# Patient Record
Sex: Male | Born: 1965 | Race: White | Hispanic: No | Marital: Married | Smoking: Never smoker
Health system: Southern US, Community
[De-identification: ages and names within clinical notes are randomized; demographics above are authoritative.]

## PROBLEM LIST (undated history)

## (undated) DIAGNOSIS — I10 Essential (primary) hypertension: Secondary | ICD-10-CM

## (undated) DIAGNOSIS — E785 Hyperlipidemia, unspecified: Secondary | ICD-10-CM

## (undated) HISTORY — PX: OTHER SURGICAL HISTORY: SHX169

## (undated) HISTORY — DX: Essential (primary) hypertension: I10

## (undated) HISTORY — DX: Hyperlipidemia, unspecified: E78.5

## (undated) HISTORY — PX: WRIST SURGERY: SHX841

---

## 2004-05-18 HISTORY — PX: OTHER SURGICAL HISTORY: SHX169

## 2004-08-28 ENCOUNTER — Emergency Department: Payer: Self-pay | Admitting: Emergency Medicine

## 2004-08-29 ENCOUNTER — Emergency Department: Payer: Self-pay | Admitting: Unknown Physician Specialty

## 2004-09-02 ENCOUNTER — Ambulatory Visit: Payer: Self-pay | Admitting: Unknown Physician Specialty

## 2004-10-20 ENCOUNTER — Ambulatory Visit: Payer: Self-pay | Admitting: Family Medicine

## 2005-03-20 ENCOUNTER — Ambulatory Visit: Payer: Self-pay | Admitting: Family Medicine

## 2005-10-15 ENCOUNTER — Ambulatory Visit: Payer: Self-pay | Admitting: Family Medicine

## 2006-09-03 ENCOUNTER — Ambulatory Visit: Payer: Self-pay | Admitting: Family Medicine

## 2006-09-03 LAB — CONVERTED CEMR LAB: Chlamydia, DNA Probe: NEGATIVE

## 2007-01-27 ENCOUNTER — Telehealth: Payer: Self-pay | Admitting: Family Medicine

## 2007-01-27 DIAGNOSIS — I10 Essential (primary) hypertension: Secondary | ICD-10-CM

## 2007-01-27 DIAGNOSIS — E785 Hyperlipidemia, unspecified: Secondary | ICD-10-CM | POA: Insufficient documentation

## 2007-01-27 DIAGNOSIS — Z87898 Personal history of other specified conditions: Secondary | ICD-10-CM

## 2007-01-28 ENCOUNTER — Encounter (INDEPENDENT_AMBULATORY_CARE_PROVIDER_SITE_OTHER): Payer: Self-pay | Admitting: *Deleted

## 2007-03-21 IMAGING — CR DG WRIST COMPLETE 3+V*R*
1 series · 4 of 4 positions shown · non-contrast
Comparison: none

REASON FOR EXAM: pain in wrist  deformed wrist   [HOSPITAL]
COMMENTS:

[Series 1: view not recorded · 0.17mm/px · 4 of 4 slices shown]
[im 1/4]
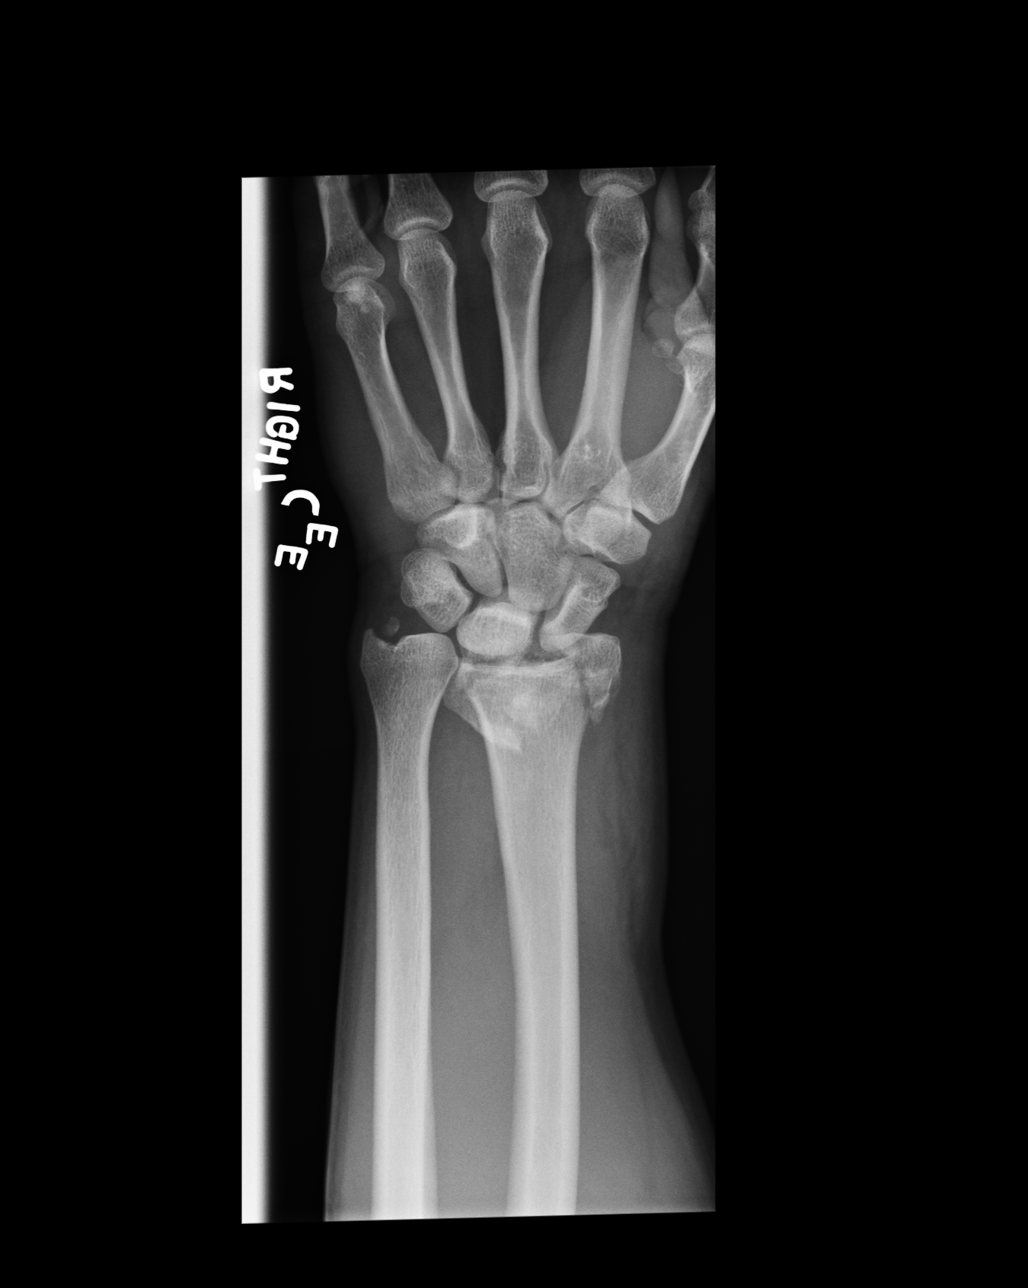
[im 2/4]
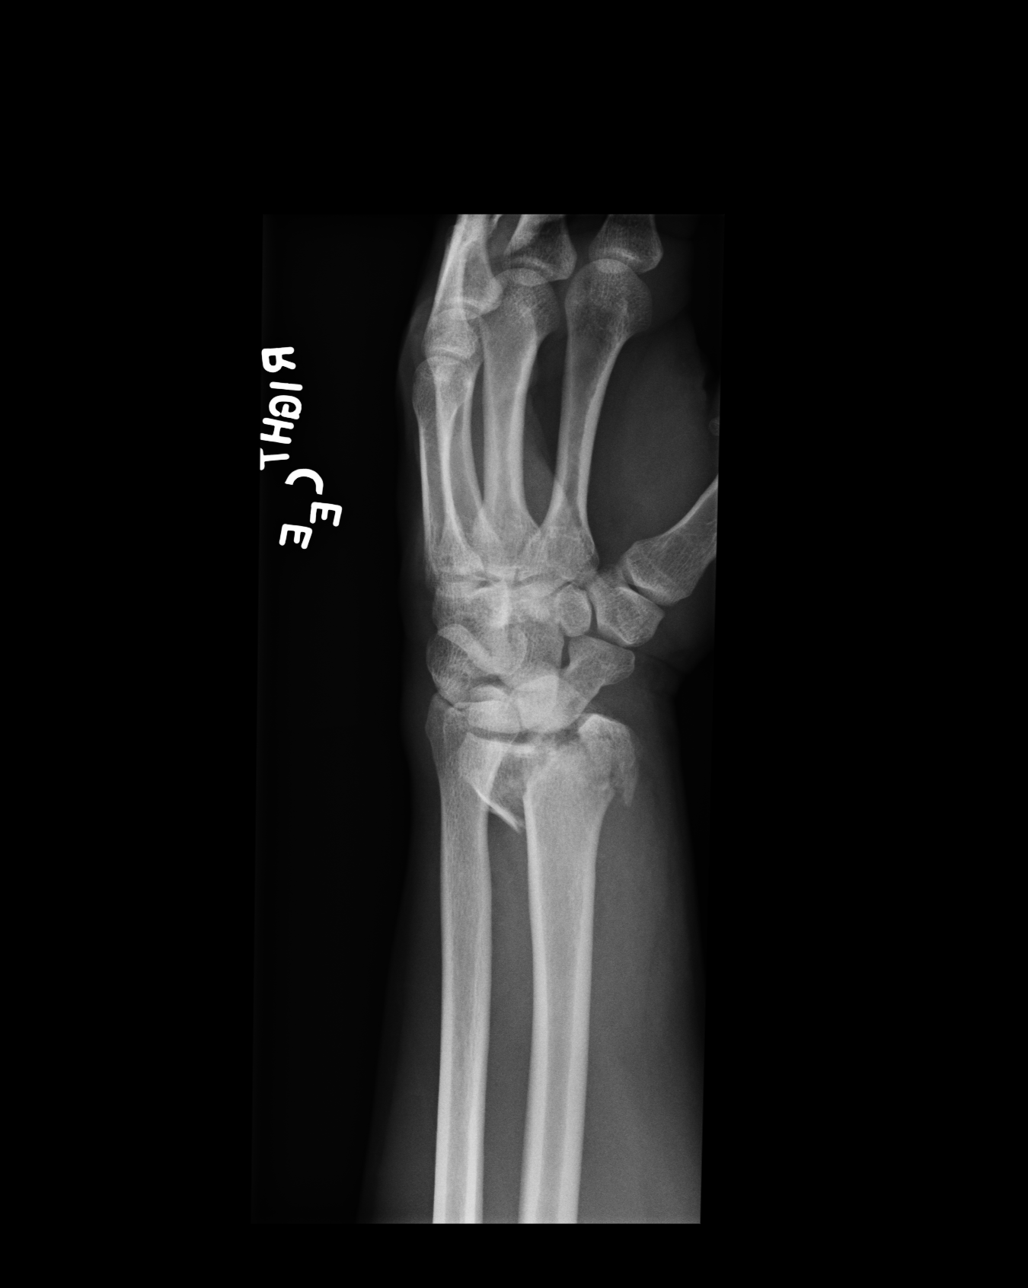
[im 3/4]
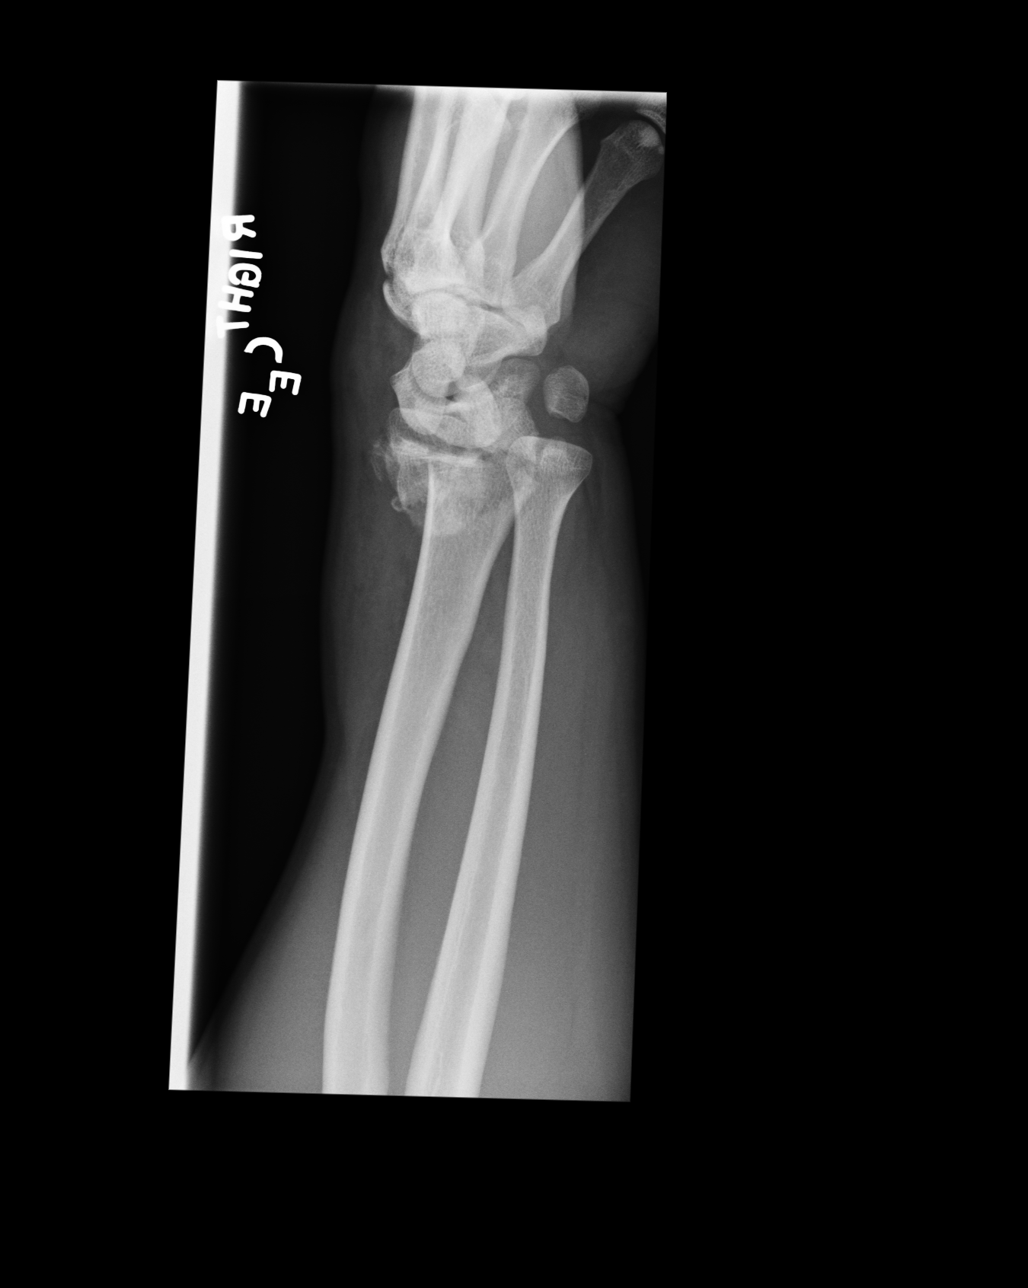
[im 4/4]
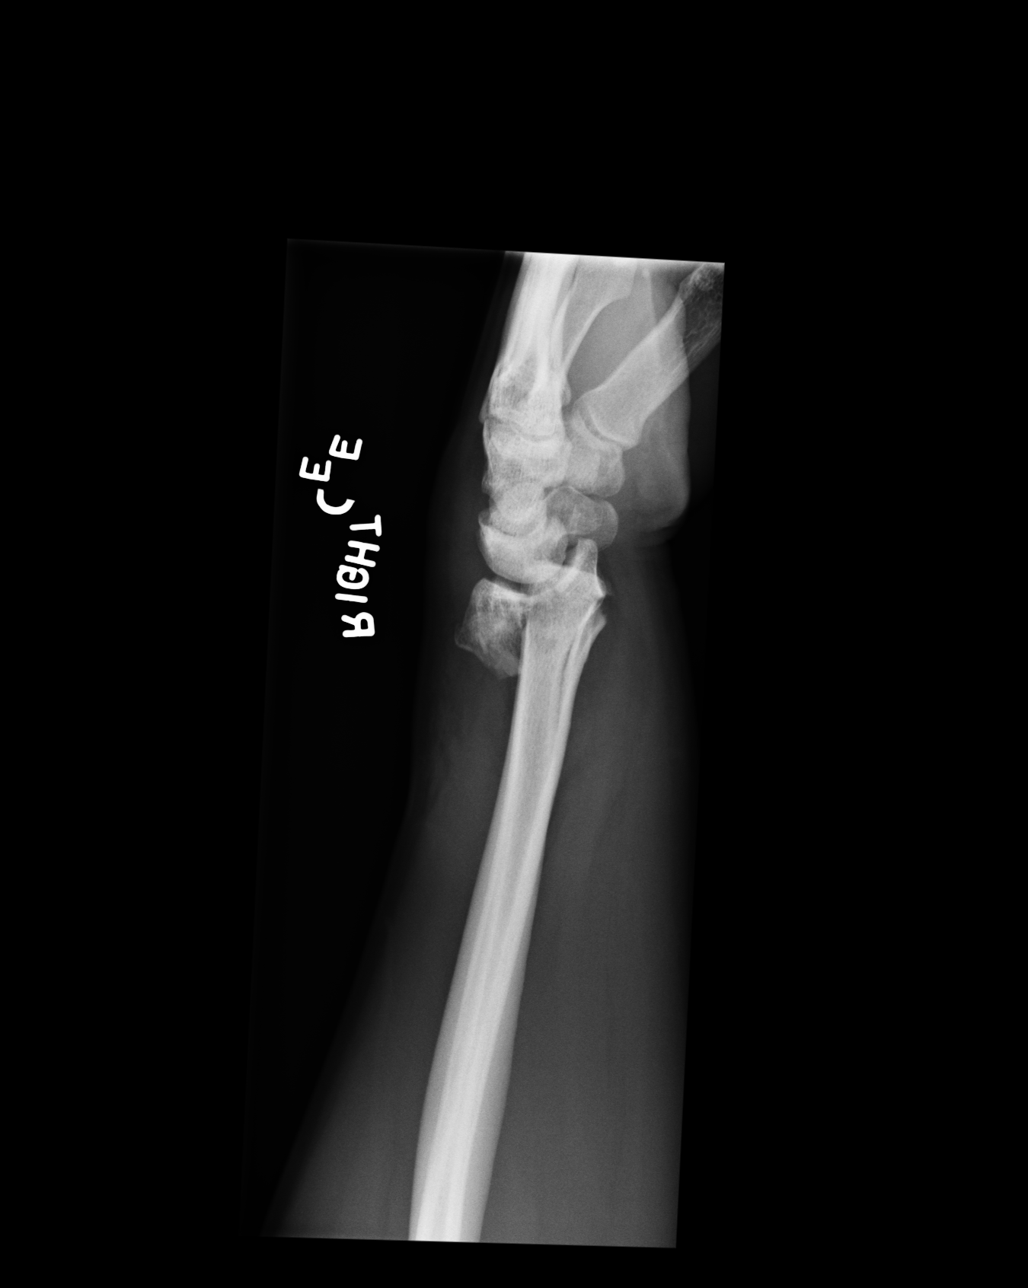

[4 of 4 positions shown; findings below may reference images not displayed]

PROCEDURE:     DXR - DXR WRIST RT COMP WITH OBLIQUES  - August 28, 2004  [DATE]

RESULT:        There is a severe comminuted fracture of the distal radius
with extension into the radiocarpal joint space at multiple sites.  The
fracture is posteriorly displaced and angulated.  There is an ulnar styloid
fracture.
IMPRESSION: See above.

## 2007-07-13 ENCOUNTER — Encounter: Payer: Self-pay | Admitting: Family Medicine

## 2007-12-07 ENCOUNTER — Ambulatory Visit: Payer: Self-pay | Admitting: Family Medicine

## 2007-12-08 LAB — CONVERTED CEMR LAB
AST: 25 units/L (ref 0–37)
Albumin: 4.2 g/dL (ref 3.5–5.2)
Alkaline Phosphatase: 101 units/L (ref 39–117)
BUN: 10 mg/dL (ref 6–23)
Basophils Relative: 0.5 % (ref 0.0–3.0)
Chloride: 106 meq/L (ref 96–112)
Creatinine, Ser: 1 mg/dL (ref 0.4–1.5)
Direct LDL: 143.6 mg/dL
Eosinophils Absolute: 0.1 10*3/uL (ref 0.0–0.7)
Eosinophils Relative: 1.4 % (ref 0.0–5.0)
GFR calc non Af Amer: 88 mL/min
Glucose, Bld: 111 mg/dL — ABNORMAL HIGH (ref 70–99)
HCT: 46.3 % (ref 39.0–52.0)
MCV: 77.3 fL — ABNORMAL LOW (ref 78.0–100.0)
Monocytes Absolute: 0.5 10*3/uL (ref 0.1–1.0)
Monocytes Relative: 7.8 % (ref 3.0–12.0)
Neutrophils Relative %: 60.1 % (ref 43.0–77.0)
Potassium: 3.9 meq/L (ref 3.5–5.1)
RBC: 5.98 M/uL — ABNORMAL HIGH (ref 4.22–5.81)
Total CHOL/HDL Ratio: 8.9
Total Protein: 7 g/dL (ref 6.0–8.3)
WBC: 6.6 10*3/uL (ref 4.5–10.5)

## 2008-03-09 ENCOUNTER — Ambulatory Visit: Payer: Self-pay | Admitting: Family Medicine

## 2008-03-13 LAB — CONVERTED CEMR LAB
ALT: 46 units/L (ref 0–53)
AST: 32 units/L (ref 0–37)
Direct LDL: 151.8 mg/dL
Hgb A1c MFr Bld: 5.4 % (ref 4.6–6.0)
Total CHOL/HDL Ratio: 8.6
Triglycerides: 234 mg/dL (ref 0–149)

## 2008-12-20 ENCOUNTER — Telehealth: Payer: Self-pay | Admitting: Family Medicine

## 2009-01-14 ENCOUNTER — Ambulatory Visit: Payer: Self-pay | Admitting: Family Medicine

## 2009-01-15 ENCOUNTER — Encounter: Payer: Self-pay | Admitting: Family Medicine

## 2009-01-15 LAB — CONVERTED CEMR LAB
ALT: 42 units/L (ref 0–53)
AST: 25 units/L (ref 0–37)
Alkaline Phosphatase: 96 units/L (ref 39–117)
Basophils Absolute: 0 10*3/uL (ref 0.0–0.1)
Bilirubin, Direct: 0 mg/dL (ref 0.0–0.3)
Eosinophils Relative: 1.3 % (ref 0.0–5.0)
Lipase: 50 units/L (ref 11.0–59.0)
Lymphocytes Relative: 18.8 % (ref 12.0–46.0)
Lymphs Abs: 1.8 10*3/uL (ref 0.7–4.0)
Monocytes Relative: 7.5 % (ref 3.0–12.0)
Neutrophils Relative %: 72.4 % (ref 43.0–77.0)
Platelets: 157 10*3/uL (ref 150.0–400.0)
RDW: 11.9 % (ref 11.5–14.6)
Total Bilirubin: 1 mg/dL (ref 0.3–1.2)
WBC: 9.4 10*3/uL (ref 4.5–10.5)

## 2009-04-06 ENCOUNTER — Emergency Department: Payer: Self-pay | Admitting: Emergency Medicine

## 2009-06-19 ENCOUNTER — Ambulatory Visit: Payer: Self-pay | Admitting: Family Medicine

## 2009-06-19 DIAGNOSIS — R109 Unspecified abdominal pain: Secondary | ICD-10-CM

## 2009-06-19 LAB — CONVERTED CEMR LAB
Bacteria, UA: 0
Blood in Urine, dipstick: NEGATIVE
Glucose, Urine, Semiquant: NEGATIVE
Ketones, urine, test strip: NEGATIVE
Nitrite: NEGATIVE
RBC / HPF: 0
Urobilinogen, UA: 0.2
WBC, UA: 0 cells/hpf
pH: 5

## 2009-06-24 LAB — CONVERTED CEMR LAB
Albumin: 4.1 g/dL (ref 3.5–5.2)
BUN: 11 mg/dL (ref 6–23)
Basophils Absolute: 0.1 10*3/uL (ref 0.0–0.1)
CO2: 31 meq/L (ref 19–32)
Eosinophils Absolute: 0.1 10*3/uL (ref 0.0–0.7)
Glucose, Bld: 92 mg/dL (ref 70–99)
HCT: 47.8 % (ref 39.0–52.0)
Hemoglobin: 15.6 g/dL (ref 13.0–17.0)
Lymphs Abs: 1.6 10*3/uL (ref 0.7–4.0)
MCHC: 32.7 g/dL (ref 30.0–36.0)
Monocytes Absolute: 0.5 10*3/uL (ref 0.1–1.0)
Neutro Abs: 5.1 10*3/uL (ref 1.4–7.7)
PSA: 0.9 ng/mL (ref 0.10–4.00)
Potassium: 4.5 meq/L (ref 3.5–5.1)
RDW: 12.1 % (ref 11.5–14.6)
Sodium: 141 meq/L (ref 135–145)

## 2009-07-30 ENCOUNTER — Ambulatory Visit: Payer: Self-pay | Admitting: Family Medicine

## 2009-07-31 LAB — CONVERTED CEMR LAB
Albumin: 3.8 g/dL (ref 3.5–5.2)
Alkaline Phosphatase: 81 units/L (ref 39–117)
Bilirubin, Direct: 0.1 mg/dL (ref 0.0–0.3)
Total Bilirubin: 0.5 mg/dL (ref 0.3–1.2)

## 2009-08-21 ENCOUNTER — Ambulatory Visit: Payer: Self-pay | Admitting: Family Medicine

## 2009-08-21 DIAGNOSIS — M2390 Unspecified internal derangement of unspecified knee: Secondary | ICD-10-CM | POA: Insufficient documentation

## 2009-09-11 ENCOUNTER — Telehealth: Payer: Self-pay | Admitting: Family Medicine

## 2009-12-13 ENCOUNTER — Ambulatory Visit: Payer: Self-pay | Admitting: Family Medicine

## 2010-06-17 NOTE — Letter (Signed)
Summary: Out of Work  [employer] [employer] [facility]  [address] [place], [place] [address]   Phone: [phone]  Fax: [phone]    June 19, 2009   Employee:  NASIAH LEHENBAUER    To Whom It May Concern:   For Medical reasons, please excuse the above named employee from work for the following dates:  Start:   06/19/2009  End:   can return 06/22/2009 if he is feeling better   If you need additional information, please feel free to contact our office.         Sincerely,    Judith Part MD

## 2010-06-17 NOTE — Consult Note (Signed)
Summary: [facility]  [facility]   Imported By: Lanelle Bal 12/25/2009 14:01:02  _____________________________________________________________________  External Attachment:    Type:   Image     Comment:   External Document

## 2010-06-17 NOTE — Assessment & Plan Note (Signed)
Summary: RIGHT KNEE PAIN   Vital Signs:  Patient profile:   45 year old male Height:      68 inches Weight:      220.0 pounds BMI:     33.57 Temp:     97.9 degrees F oral Pulse rate:   68 / minute Pulse rhythm:   regular BP sitting:   130 / 90  (left arm) Cuff size:   regular  Vitals Entered By: Benny Lennert CMA Duncan Dull) (August 21, 2009 10:46 AM)  History of Present Illness: Chief complaint right knee pain for about 35 weeks  45 year old male:  Started to get some, localized medially.  No specific injury.  No trauma, but was playing basketball. Started to get sore.    L ACL -- HB K did 15 years.  s/p arthroscopy x 3 on L  No effusions no mech locking up no symptomatic giving way   REVIEW OF SYSTEMS  GEN: No systemic complaints, no fevers, chills, sweats, or other acute illnesses MSK: Detailed in the HPI GI: tolerating PO intake without difficulty Neuro: No numbness, parasthesias, or tingling associated. Otherwise the pertinent positives of the ROS are noted above.     GEN: Well-developed,well-nourished,in no acute distress; alert,appropriate and cooperative throughout examination HEENT: Normocephalic and atraumatic without obvious abnormalities. No apparent alopecia or balding. Ears, externally no deformities PULM: Breathing comfortably in no respiratory distress EXT: No clubbing, cyanosis, or edema PSYCH: Normally interactive. Cooperative during the interview. Pleasant. Friendly and conversant. Not anxious or depressed appearing. Normal, full affect.   Gait: Normal heel toe pattern ROM: WNL Effusion: neg Echymosis or edema: none Patellar tendon NT Painful PLICA: neg, + much, but not tender Patellar grind: negative Medial and lateral patellar facet loading: negative medial and lateral joint lines:anterior medial joint line tenderness Mcmurray's some mild pain, no mechanical Flexion-pinch neg Varus and valgus stress: stable Lachman: neg Ant and Post  drawer: neg Hip abduction, IR, ER: WNL Hip flexion str: 5/5 Hip abd: 5/5 Quad: 5/5 VMO atrophy:No Hamstring concentric and eccentric: 5/5   Allergies (verified): No Known Drug Allergies  Past History:  Past medical, surgical, family and social histories (including risk factors) reviewed, and no changes noted (except as noted below).  Past Medical History: Reviewed history from 12/07/2007 and no changes required. Hyperlipidemia Hypertension migraine  Past Surgical History: Reviewed history from 12/07/2007 and no changes required. Left knee surgery- ACL Right arm fracture- surgery (2006) wrist sx  Family History: Reviewed history from 12/07/2007 and no changes required. Father: unknown Mother: deceased- lung cancer, smoker Siblings: 1 brother, 1 sister sister-- skin cancer (not melanoma)  Social History: Reviewed history from 12/07/2007 and no changes required. Marital Status: Married Children: 2 Occupation: Systems analyst non smoker  occ alcohol    Impression & Recommendations:  Problem # 1:  INTERNAL DERANGEMENT, RIGHT KNEE (ICD-717.9) Assessment New  suspect deg meniscal tear  conservative trial Ice 20 minutes, at least 3 times a day Etodolac  Knee Injection, R Patient verbally consented to procedure. Risks, benefits, and alternatives explained. Sterilely prepped with betadine. Ethyl cholride used for anesthesia. 9 cc 0.5% Marcaine mixed with 1 cc of Kenalog 40 mg injected using the anterolateral approach without difficulty. No complications with procedure and tolerated well. Patient had decreased pain post-injection.     Orders: Joint Aspirate / Injection, Large (20610) Kenalog 10mg  (4units) (J3301)  Complete Medication List: 1)  Lisinopril 20 Mg Tabs (Lisinopril) .... One by mouth once daily 2)  Etodolac  400 Mg Tabs (Etodolac) .Marland Kitchen.. 1 by mouth two times a day  Patient Instructions: 1)  f/u 4-6 weeks Prescriptions: ETODOLAC 400 MG TABS (ETODOLAC) 1  by mouth two times a day  #60 x 2   Entered and Authorized by:   Hannah Beat MD   Signed by:   Hannah Beat MD on 08/21/2009   Method used:   [facility] to        [facility] [phone]* (retail)       [address] [place], [place]  [address]       Ph: [phone] or [phone]       Fax: [phone]   RxID:   [id number]   Current Allergies (reviewed today): No known allergies

## 2010-06-17 NOTE — Assessment & Plan Note (Signed)
Summary: ABD PAIN, MED REFILL CYD   Vital Signs:  Patient profile:   45 year old male Weight:      216 pounds Temp:     98.3 degrees F oral Pulse rate:   68 / minute Pulse rhythm:   regular BP sitting:   140 / 100  (left arm) Cuff size:   regular  Vitals Entered By: Lowella Petties CMA (June 19, 2009 12:16 PM) CC: Low abd pain, goes into groin and down legs   History of Present Illness: is not feeling well   symptoms started sat night -- got a little soreness in very low abd and groin area  felt muscular to him  sunday am - was much worse and he could barely walk  yesterday - was somewhat better - then worse as the day goes on  soreness is still there- worse the longer he is on his feet   is across whole lower abd  no masses or bulges / and is worse if he strains  no particular trauma or activity pain rad to upper thighs -- like a dull ache   was hobbling around yesterday  hurts to stretch or straighten up  no GI symptoms at all  last bm was yesterday  no blood in stool  no urinary symptoms at all - no hematuria or dysuria   bp is high today-- took bp med about an hour ago  has been normal-- no spikes lately  normal at his work PE recently  Allergies: No Known Drug Allergies  Past History:  Past Medical History: Last updated: 12/07/2007 Hyperlipidemia Hypertension migraine  Past Surgical History: Last updated: 12/07/2007 Left knee surgery- ACL Right arm fracture- surgery (2006) wrist sx  Family History: Last updated: 12/07/2007 Father: unknown Mother: deceased- lung cancer, smoker Siblings: 1 brother, 1 sister sister-- skin cancer (not melanoma)  Social History: Last updated: 12/07/2007 Marital Status: Married Children: 2 Occupation: Systems analyst non smoker  occ alcohol   Risk Factors: Smoking Status: never (01/27/2007)  Review of Systems General:  Denies chills, fatigue, fever, loss of appetite, and malaise. Eyes:  Denies blurring  and eye irritation. CV:  Denies chest pain or discomfort and palpitations. Resp:  Denies chest discomfort, cough, and shortness of breath. GI:  Complains of abdominal pain; denies bloody stools, change in bowel habits, constipation, dark tarry stools, diarrhea, gas, nausea, and vomiting. Derm:  Denies itching, lesion(s), poor wound healing, and rash. Neuro:  Denies numbness, tingling, and weakness. Psych:  Denies anxiety and depression. Endo:  Denies excessive thirst and excessive urination.  Physical Exam  General:  overweight but generally well appearing  Head:  normocephalic, atraumatic, and no abnormalities observed.   Eyes:  vision grossly intact, pupils equal, pupils round, and pupils reactive to light.   Mouth:  pharynx pink and moist.   Neck:  supple with full rom and no masses or thyromegally, no JVD or carotid bruit  Chest Wall:  No deformities, masses, tenderness or gynecomastia noted. Lungs:  Normal respiratory effort, chest expands symmetrically. Lungs are clear to auscultation, no crackles or wheezes. Heart:  Normal rate and regular rhythm. S1 and S2 normal without gallop, murmur, click, rub or other extra sounds. Abdomen:  mildly tender over low abdomen without rebound or gaurding  soft, normal bowel sounds, no distention, and no masses.   Msk:  no CVA tenderness  Extremities:  No clubbing, cyanosis, edema, or deformity noted with normal full range of motion of all joints.  Skin:  Intact without suspicious lesions or rashes Cervical Nodes:  No lymphadenopathy noted Inguinal Nodes:  No significant adenopathy Psych:  normal affect, talkative and pleasant    Impression & Recommendations:  Problem # 1:  ABDOMINAL PAIN, LOWER (ICD-789.09) Assessment New fairly benign  exam with nl ua and symptoms are positional  suspect groin muscular strain  labs today  trial of mobic and heat  update if any new symptoms  His updated medication list for this problem includes:     Mobic 15 Mg Tabs (Meloxicam) .Marland Kitchen... 1 by mouth once daily with food as needed pain  Orders: Venipuncture ([address]) TLB-BMP (Basic Metabolic Panel-BMET) (80048-METABOL) TLB-CBC Platelet - w/Differential (85025-CBCD) TLB-Hepatic/Liver Function Pnl (80076-HEPATIC) TLB-PSA (Prostate Specific Antigen) (84153-PSA) UA Dipstick W/ Micro (manual) ([address])  Problem # 2:  HYPERTENSION (ICD-401.9) Assessment: Deteriorated  bp is up today -- pt states has been very well controlled at home could be due to pain will check bp out of office and update His updated medication list for this problem includes:    Lisinopril 20 Mg Tabs (Lisinopril) ..... One by mouth once daily  BP today: 140/100 Prior BP: 108/74 (01/14/2009)  Labs Reviewed: K+: 3.9 (12/07/2007) Creat: : 1.0 (12/07/2007)   Chol: 263 (03/09/2008)   HDL: 30.7 (03/09/2008)   LDL: DEL (03/09/2008)   TG: 234 (03/09/2008)  Orders: UA Dipstick W/ Micro (manual) ([address]11)  Complete Medication List: 1)  Lisinopril 20 Mg Tabs (Lisinopril) .... One by mouth once daily 2)  Mobic 15 Mg Tabs (Meloxicam) .Marland Kitchen.. 1 by mouth once daily with food as needed pain  Patient Instructions: 1)  off work thurs and fri 2)  avoid heavy lifting  3)  if pain gets severe call or seek care  4)  use warm compress 5)  labs today 6)  try mobic 1 pill daily with food  7)  will update with labs  Prescriptions: LISINOPRIL 20 MG  TABS (LISINOPRIL) one by mouth once daily  #15 x 0   Entered and Authorized by:   Judith Part MD   Signed by:   Judith Part MD on 06/19/2009   Method used:   [facility]ron[facility] to        [facility] [phone]* (retail)       [address] [place], [place]  [address]       Ph: [phone] or [phone]       Fax: [phone]   RxID:   [id number] LISINOPRIL 20 MG  TABS (LISINOPRIL) one by mouth once daily  #90 x 3   Entered and Authorized by:   Judith Part MD   Signed by:   Judith Part MD on  06/19/2009   Method used:   Print then Give to Patient   RxID:   [id number] MOBIC 15 MG TABS (MELOXICAM) 1 by mouth once daily with food as needed pain  #30 x 0   Entered and Authorized by:   Judith Part MD   Signed by:   Judith Part MD on 06/19/2009   Method used:   Electronically to        [facility] [phone]* (retail)       [address] [place], [place]  [address]       Ph: [phone] or [phone]  Fax: [phone]   RxID:   [id number]   Prior Medications (reviewed today): Current Allergies: No known allergies    Laboratory Results   Urine Tests  Date/Time Received: June 19, 2009 12:47 PM  Date/Time Reported: June 19, 2009 12:47 PM   Routine Urinalysis   Color: yellow Appearance: Clear Glucose: negative   (Normal Range: Negative) Bilirubin: negative   (Normal Range: Negative) Ketone: negative   (Normal Range: Negative) Spec. Gravity: 1.020   (Normal Range: 1.003-1.035) Blood: negative   (Normal Range: Negative) pH: 5.0   (Normal Range: 5.0-8.0) Protein: negative   (Normal Range: Negative) Urobilinogen: 0.2   (Normal Range: 0-1) Nitrite: negative   (Normal Range: Negative) Leukocyte Esterace: negative   (Normal Range: Negative)  Urine Microscopic WBC/HPF: 0 RBC/HPF: 0 Bacteria/HPF: 0 Mucous/HPF: 0 Epithelial/HPF: 1-2 Crystals/HPF: 0 Casts/LPF: 0 Yeast/HPF: 0 Other: 0

## 2010-06-17 NOTE — Assessment & Plan Note (Signed)
Summary: AGAIN HAS GROIN PAIN   Vital Signs:  Patient profile:   45 year old male Height:      68 inches Weight:      215.50 pounds BMI:     32.89 Temp:     98.1 degrees F oral Pulse rate:   72 / minute Pulse rhythm:   regular BP sitting:   128 / 80  (left arm) Cuff size:   large  Vitals Entered By: Lewanda Rife LPN (December 13, 2009 12:04 PM) CC: Groin pain going into both legs 12/12/09 pain was in left groin and leg today pain level is 1.   History of Present Illness: last time pain got better in a week  has come and gone -- mildly   noticed past sat night -- had gone to the pool dull lower abd pain ref to legs - right in the creases of his leg  did not pay much attn  woke up sunday am- was really bad again - could hardly ever walk  is quite a bit better today- has been about at week took the etodolac ( from Dr Patsy Lager for knee)  it only hurt when he walked and moved -- not at rest  each day hurt in a bit of a different area  2nd day pelvic area  then just in left leg back is ok  occ tailbone hurts when it hurts   no hx of a kidney stone  no urinary or bowel symptoms at all  no fever or upset stomach      Allergies (verified): No Known Drug Allergies  Past History:  Past Medical History: Last updated: 12/07/2007 Hyperlipidemia Hypertension migraine  Past Surgical History: Last updated: 12/07/2007 Left knee surgery- ACL Right arm fracture- surgery (2006) wrist sx  Family History: Last updated: 12/07/2007 Father: unknown Mother: deceased- lung cancer, smoker Siblings: 1 brother, 1 sister sister-- skin cancer (not melanoma)  Social History: Last updated: 12/07/2007 Marital Status: Married Children: 2 Occupation: Systems analyst non smoker  occ alcohol   Risk Factors: Smoking Status: never (01/27/2007)  Review of Systems General:  Denies chills, fatigue, fever, and loss of appetite. Eyes:  Denies blurring and eye irritation. CV:  Denies chest  pain or discomfort, palpitations, shortness of breath with exertion, and swelling of feet. Resp:  Denies cough and wheezing. GI:  Denies abdominal pain, bloody stools, change in bowel habits, constipation, indigestion, nausea, and vomiting. GU:  Denies dysuria, hematuria, incontinence, nocturia, urinary frequency, and urinary hesitancy. MS:  Denies joint redness, joint swelling, cramps, and muscle weakness. Derm:  Denies itching, lesion(s), poor wound healing, and rash. Neuro:  Denies numbness and tingling. Psych:  Denies anxiety and depression. Endo:  Denies cold intolerance and heat intolerance.  Physical Exam  General:  overwt and well appearing Head:  normocephalic, atraumatic, and no abnormalities observed.   Eyes:  vision grossly intact, pupils equal, pupils round, pupils reactive to light, and no injection.   Mouth:  pharynx pink and moist.   Neck:  nl rom , no tenderness Lungs:  Normal respiratory effort, chest expands symmetrically. Lungs are clear to auscultation, no crackles or wheezes. Heart:  Normal rate and regular rhythm. S1 and S2 normal without gallop, murmur, click, rub or other extra sounds. Abdomen:  no abd tenderness no suprapubic tenderness or fullness felt  soft, normal bowel sounds, no distention, no masses, no abdominal hernia, no hepatomegaly, and no splenomegaly.   Msk:  tender in L groin/ hip area (no LN  noted) nl slr bilat pain to int rotate L hip today  no troch tenderness no LS or buttock tenderness nl gait  (per pt - symptoms are better today)   Pulses:  R and L carotid,radial,femoral,dorsalis pedis and posterior tibial pulses are full and equal bilaterally Extremities:  No clubbing, cyanosis, edema, or deformity noted with normal full range of motion of all joints.   Neurologic:  sensation intact to light touch, gait normal, and DTRs symmetrical and normal.   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 radiculopathy/ tendonitis  favors MS etiology over urinary (nl GU exam in past and no hernias )  will ref to ortho for eval if determined not to be musculoskelatal will consider urol work up and imaging His updated medication list for this problem includes:    Etodolac 400 Mg Tabs (Etodolac) .Marland Kitchen... 1 by mouth two times a day as needed  Orders: Orthopedic Referral (Ortho)  Complete Medication List: 1)  Lisinopril 20 Mg Tabs (Lisinopril) .... One by mouth once daily 2)  Etodolac 400 Mg Tabs (Etodolac) .Marland Kitchen.. 1 by mouth two times a day as needed  Patient Instructions: 1)  we will do referral to orthopedics at check out  2)  if pain worsens or if any other symptoms - like blood in urine or frequent urination- please let me know   Current Allergies (reviewed today): No known allergies

## 2010-06-17 NOTE — Progress Notes (Signed)
Summary: again has groin pain  Phone Note Call from Patient Call back at Home Phone [phone]   Caller: Patient Call For: Judith Part MD Summary of Call: Pt again has the groin and leg pain that he saw you for in february.  He says the sxs are the same.  He is asking if you want to see him again or does he need to be referred somewhere.  If he is referred he prefers to go to [facility].  Wife called later and said that pt is in extreme pain and has been for 2 days.  She thinks something was missed at previous office visit and asks if she needs to take him to the ER.            Lowella Petties CMA  September 11, 2009 12:45 PM  Initial call taken by: Lowella Petties CMA,  September 11, 2009 9:22 AM  Follow-up for Phone Call        if his pain is severe- definitely take him to the ER- they may need to do some imaging studies  update me afterwards- will send for notes and then make further plan Follow-up by: Judith Part MD,  September 11, 2009 1:30 PM  Additional Follow-up for Phone Call Additional follow up Details #1::        [facility] for pt to call.       Lowella Petties CMA  September 11, 2009 1:58 PM   Patient called back and was notified. Is going to ER.  Additional Follow-up by: Melody Comas,  September 11, 2009 2:00 PM

## 2010-08-15 ENCOUNTER — Other Ambulatory Visit: Payer: Self-pay | Admitting: Family Medicine

## 2010-08-15 NOTE — Telephone Encounter (Signed)
Pt  Needs to call for appt.

## 2010-11-13 ENCOUNTER — Other Ambulatory Visit: Payer: Self-pay | Admitting: Family Medicine

## 2010-11-13 NOTE — Telephone Encounter (Signed)
Pt must call for appt. 

## 2011-01-14 ENCOUNTER — Telehealth: Payer: Self-pay | Admitting: *Deleted

## 2011-01-14 NOTE — Telephone Encounter (Signed)
Agree with above - thank you

## 2011-01-14 NOTE — Telephone Encounter (Signed)
Pt states he has had cold symptoms since yesterday.  He has a cough with a small amount of light green mucous, some body aches.  No fever.  Asked what he can do.  Advised otc mucous relief expectorant with lots of fluids, tylenol or motrin for the achiness, rest. Advised to call back in a couple of days if not better or if worse.

## 2011-03-23 ENCOUNTER — Encounter: Payer: Self-pay | Admitting: Family Medicine

## 2011-03-23 ENCOUNTER — Ambulatory Visit (INDEPENDENT_AMBULATORY_CARE_PROVIDER_SITE_OTHER): Payer: Commercial Indemnity | Admitting: Family Medicine

## 2011-03-23 VITALS — BP 136/90 | HR 72 | Temp 98.3°F | Wt 222.0 lb

## 2011-03-23 DIAGNOSIS — R300 Dysuria: Secondary | ICD-10-CM

## 2011-03-23 LAB — POCT UA - MICROSCOPIC ONLY
Epithelial cells, urine per micros: 0
Mucus, UA: 0
RBC, urine, microscopic: 0

## 2011-03-23 LAB — POCT URINALYSIS DIPSTICK
Ketones, UA: NEGATIVE
Protein, UA: NEGATIVE
Spec Grav, UA: 1.025
pH, UA: 6

## 2011-03-23 NOTE — Progress Notes (Signed)
"  I hope I have a UTI."  Tightness at tip of penis.  Some pus from distal penis. Separated from wife for a few months but had unprotected contact with another person before symptoms started.  No FCNAV.  No urgency.  Pressure with urination.  H/u UTI prev.  H/o STD: nongonococcal urethritis at ~21.    Meds, vitals, and allergies reviewed.   ROS: See HPI.  Otherwise, noncontributory.  nad ncat Mmm rrr Testes bilaterally descended without nodularity, tenderness or masses. No scrotal masses or lesions. No penis lesions or urethral discharge.

## 2011-03-23 NOTE — Patient Instructions (Addendum)
Don't have unprotected sex.  Recheck HIV in 2 months.   Come back for urine sample recollection.  Don't urinate for at least 1 hour before collection. We'll contact you with your lab report.

## 2011-03-24 LAB — HIV ANTIBODY (ROUTINE TESTING W REFLEX): HIV: NONREACTIVE

## 2011-03-24 LAB — GC/CHLAMYDIA PROBE AMP, URINE
Chlamydia, Swab/Urine, PCR: NEGATIVE
GC Probe Amp, Urine: NEGATIVE

## 2011-03-24 NOTE — Assessment & Plan Note (Signed)
No trich on micro, HIV and RPR neg today.  Awaiting ucx and chlam/GC.  Normal exam.  D/w pt about getting repeat HIV in 2 months, not having unprotected sex and he's okay with [place] calling his cell phone about results.

## 2011-03-25 ENCOUNTER — Telehealth: Payer: Self-pay | Admitting: Family Medicine

## 2011-03-25 MED ORDER — AZITHROMYCIN 500 MG PO TABS: 1000.0000 mg | ORAL_TABLET | Freq: Once | ORAL | Status: AC

## 2011-03-25 NOTE — Telephone Encounter (Signed)
rx sent

## 2011-03-27 ENCOUNTER — Telehealth: Payer: Self-pay | Admitting: Internal Medicine

## 2011-03-27 NOTE — Telephone Encounter (Signed)
Advised pt

## 2011-03-27 NOTE — Telephone Encounter (Signed)
Patient called and stated he just finished the medication you prescribed but he is still having symptoms.  He wanted to know if he should give it some time or if he needed to make another appt to be seen.  Please advise.

## 2011-03-27 NOTE — Telephone Encounter (Signed)
I would give it a few more days, if not improved next week then schedule f/u.  Thanks.

## 2011-03-31 ENCOUNTER — Encounter: Payer: Self-pay | Admitting: Family Medicine

## 2011-03-31 ENCOUNTER — Ambulatory Visit (INDEPENDENT_AMBULATORY_CARE_PROVIDER_SITE_OTHER): Payer: Commercial Indemnity | Admitting: Family Medicine

## 2011-03-31 VITALS — BP 122/82 | HR 68 | Temp 98.4°F | Wt 225.8 lb

## 2011-03-31 DIAGNOSIS — M25559 Pain in unspecified hip: Secondary | ICD-10-CM

## 2011-03-31 DIAGNOSIS — R300 Dysuria: Secondary | ICD-10-CM

## 2011-03-31 DIAGNOSIS — R1030 Lower abdominal pain, unspecified: Secondary | ICD-10-CM

## 2011-03-31 DIAGNOSIS — R109 Unspecified abdominal pain: Secondary | ICD-10-CM

## 2011-03-31 MED ORDER — TRAMADOL HCL 50 MG PO TABS
50.0000 mg | ORAL_TABLET | Freq: Four times a day (QID) | ORAL | Status: AC | PRN
Start: 2011-03-31 — End: 2012-03-30

## 2011-03-31 NOTE — Patient Instructions (Signed)
See Aram Beecham about your referral before you leave today.  Take the tramadol for pain- not before or during work- it can make you drowsy.

## 2011-03-31 NOTE — Progress Notes (Signed)
Still with small amount of discharge from penis but this is improving.  S/p course of azithro.  Again d/w pt about protected sex and rechecking HIV as planned.   Hip and groin pain, going for years intermittently.  Can be on either side.  Saturday with R lateral hip pain, Sunday with B medial thigh pain, now with L groin and L lateral hip pain.  Prev with ortho eval and uro eval, unknown source of symptoms.  This is the second flare this year, can happen irregularly.  It will normally last a few days and then resolve.  The pain can be bad enough to limit walking.  No trauma, no cause known.  This episode started after a few hours of driving.  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 No pain from knees distally.  Pain doesn't radiate down the legs.  He's better today than Sunday.  Lodine may or may not help, he doesn't remember much effect.  Keeps wallet in front pocket.    Pt requested second opinion from Dr. Charlann Boxer.   Meds, vitals, and allergies reviewed.   ROS: See HPI.  Otherwise, noncontributory.  nad ncat Normal rom on the hips, normal int/ext rotation Not ttp over the greater troch Back w/o midline pain, no pain on SI testing B, distally NV intact No bruising in groin, no hernia noted.  Testes bilaterally descended without nodularity, tenderness or masses. No scrotal masses or lesions. No penis lesions or urethral discharge.

## 2011-04-01 ENCOUNTER — Encounter: Payer: Self-pay | Admitting: Family Medicine

## 2011-04-01 DIAGNOSIS — M25559 Pain in unspecified hip: Secondary | ICD-10-CM | POA: Insufficient documentation

## 2011-04-01 NOTE — Assessment & Plan Note (Signed)
Improved, f/u if this doesn't totally resolve.  Recheck HIV later as planned. D/w pt about using protection

## 2011-04-01 NOTE — Assessment & Plan Note (Addendum)
I can't explain the sx and told the pt at much.  He can try tramadol with sedation caution and then f/u with ortho.  I would like him to get second opinion.

## 2011-07-21 ENCOUNTER — Other Ambulatory Visit: Payer: Self-pay | Admitting: *Deleted

## 2011-07-21 MED ORDER — LISINOPRIL 20 MG PO TABS: 20.0000 mg | ORAL_TABLET | Freq: Every day | ORAL | Status: DC

## 2011-07-21 NOTE — Telephone Encounter (Signed)
Will refill electronically  

## 2011-07-21 NOTE — Telephone Encounter (Signed)
Left message on cell phone voicemail advising patient that Rx was [facility].

## 2011-07-21 NOTE — Telephone Encounter (Signed)
Patient called stating that he is almost out of his Lisinopril and would like a two week supply [facility] to last him until his upcoming appointment. Is it okay to refill medication?

## 2011-07-31 ENCOUNTER — Encounter: Payer: Self-pay | Admitting: Family Medicine

## 2011-07-31 ENCOUNTER — Ambulatory Visit (INDEPENDENT_AMBULATORY_CARE_PROVIDER_SITE_OTHER): Payer: BC Managed Care – PPO | Admitting: Family Medicine

## 2011-07-31 VITALS — BP 110/72 | HR 72 | Temp 97.7°F | Ht 68.0 in | Wt 230.2 lb

## 2011-07-31 DIAGNOSIS — E785 Hyperlipidemia, unspecified: Secondary | ICD-10-CM

## 2011-07-31 DIAGNOSIS — I10 Essential (primary) hypertension: Secondary | ICD-10-CM

## 2011-07-31 DIAGNOSIS — E669 Obesity, unspecified: Secondary | ICD-10-CM | POA: Insufficient documentation

## 2011-07-31 DIAGNOSIS — Z202 Contact with and (suspected) exposure to infections with a predominantly sexual mode of transmission: Secondary | ICD-10-CM

## 2011-07-31 DIAGNOSIS — Z23 Encounter for immunization: Secondary | ICD-10-CM

## 2011-07-31 DIAGNOSIS — R300 Dysuria: Secondary | ICD-10-CM

## 2011-07-31 LAB — LDL CHOLESTEROL, DIRECT: Direct LDL: 188.4 mg/dL

## 2011-07-31 LAB — LIPID PANEL
Cholesterol: 304 mg/dL — ABNORMAL HIGH (ref 0–200)
HDL: 40.4 mg/dL (ref >39.00)
Triglycerides: 297 mg/dL — ABNORMAL HIGH (ref 0.0–149.0)
VLDL: 59.4 mg/dL — ABNORMAL HIGH (ref 0.0–40.0)

## 2011-07-31 LAB — COMPREHENSIVE METABOLIC PANEL
Alkaline Phosphatase: 84 U/L (ref 39–117)
Creatinine, Ser: 1.3 mg/dL (ref 0.4–1.5)
Glucose, Bld: 102 mg/dL — ABNORMAL HIGH (ref 70–99)
Sodium: 141 mEq/L (ref 135–145)
Total Bilirubin: 0.6 mg/dL (ref 0.3–1.2)
Total Protein: 7.4 g/dL (ref 6.0–8.3)

## 2011-07-31 LAB — CBC WITH DIFFERENTIAL/PLATELET
Eosinophils Relative: 1.3 % (ref 0.0–5.0)
HCT: 49.5 % (ref 39.0–52.0)
Lymphs Abs: 1.8 10*3/uL (ref 0.7–4.0)
MCV: 79.5 fl (ref 78.0–100.0)
Monocytes Absolute: 0.5 10*3/uL (ref 0.1–1.0)
Platelets: 201 10*3/uL (ref 150.0–400.0)
RDW: 13.1 % (ref 11.5–14.6)
WBC: 6.4 10*3/uL (ref 4.5–10.5)

## 2011-07-31 LAB — TSH: TSH: 3.55 u[IU]/mL (ref 0.35–5.50)

## 2011-07-31 MED ORDER — LISINOPRIL 20 MG PO TABS: 20.0000 mg | ORAL_TABLET | Freq: Every day | ORAL | Status: DC

## 2011-07-31 NOTE — Progress Notes (Signed)
Subjective:    Patient ID: Gary Todd, male    DOB: 1966-05-14, 46 y.o.   MRN: [id number]  HPI Here for f/u of HTN and hyperlipidemia Nothing new going on medically   Needs his 2nd HIV test today- is already ordered from Dr Para March    bp is 110/72    Today No cp or palpitations or headaches or edema  No side effects to medicines    On ace     Chemistry      Component Value Date/Time   NA 141 06/19/2009 1302   K 4.5 06/19/2009 1302   CL 106 06/19/2009 1302   CO2 31 06/19/2009 1302   BUN 11 06/19/2009 1302   CREATININE 1.3 06/19/2009 1302      Component Value Date/Time   CALCIUM 9.6 06/19/2009 1302   ALKPHOS 81 07/30/2009 0904   AST 32 07/30/2009 0904   ALT 52 07/30/2009 0904   BILITOT 0.5 07/30/2009 0904       Lab Results  Component Value Date   CHOL 263* 03/09/2008   HDL 30.7* 03/09/2008   LDLDIRECT 151.8 03/09/2008   TRIG 234* 03/09/2008   CHOLHDL 8.6 CALC 03/09/2008     Diet-is bad in general -- no time to pack his lunch  Cholesterol may be high  Exercise-- will be getting an exercise bike   Wt is up 5 lb with bmi of 35 Keeps gaining weight and is distressed over that   imms--last TD? Did get a flu shot this year  Patient Active Problem List  Diagnoses  . HYPERLIPIDEMIA  . HYPERTENSION  . INTERNAL DERANGEMENT, RIGHT KNEE  . Abdominal  pain, other specified site  . MIGRAINES, HX OF  . Dysuria  . Hip pain  . Obese  . Exposure to STD   Past Medical History  Diagnosis Date  . Hyperlipidemia   . Hypertension   . Migraine    Past Surgical History  Procedure Date  . Left knee surgery     ACL  . Right arm fracture - surgery 2006  . Wrist surgery    History  Substance Use Topics  . Smoking status: Never Smoker   . Smokeless tobacco: Never Used  . Alcohol Use: Yes     Occasional   Family History  Problem Relation Age of Onset  . Cancer Mother     Lung CA, smoker  . Cancer Sister     Skin CA, not melanoma   No Known Allergies Current  Outpatient Prescriptions on File Prior to Visit  Medication Sig Dispense Refill  . etodolac (LODINE) 400 MG tablet Take 400 mg by mouth 2 (two) times daily as needed.        . traMADol (ULTRAM) 50 MG tablet Take 1 tablet (50 mg total) by mouth every 6 (six) hours as needed for pain. Maximum dose= 8 tablets per day  30 tablet  1     Review of Systems Review of Systems  Constitutional: Negative for fever, appetite change, fatigue and unexpected weight change.  Eyes: Negative for pain and visual disturbance.  Respiratory: Negative for cough and shortness of breath.   Cardiovascular: Negative for cp or palpitations    Gastrointestinal: Negative for nausea, diarrhea and constipation.  Genitourinary: Negative for urgency and frequency.  Skin: Negative for pallor or rash   Neurological: Negative for weakness, light-headedness, numbness and headaches.  Hematological: Negative for adenopathy. Does not bruise/bleed easily.  Psychiatric/Behavioral: Negative for dysphoric mood. The patient is not nervous/anxious.  Objective:   Physical Exam  Constitutional: He appears well-developed and well-nourished. No distress.       Obese and well appearing   HENT:  Head: Normocephalic and atraumatic.  Mouth/Throat: Oropharynx is clear and moist.  Eyes: Conjunctivae and EOM are normal. Pupils are equal, round, and reactive to light. No scleral icterus.  Neck: Normal range of motion. Neck supple. No JVD present. Carotid bruit is not present. No thyromegaly present.  Cardiovascular: Normal rate, regular rhythm, normal heart sounds and intact distal pulses.  Exam reveals no gallop.   Pulmonary/Chest: Effort normal and breath sounds normal. No respiratory distress. He has no wheezes.  Abdominal: Soft. Bowel sounds are normal. He exhibits no distension, no abdominal bruit and no mass. There is no tenderness.  Musculoskeletal: Normal range of motion. He exhibits no edema and no tenderness.    Lymphadenopathy:    He has no cervical adenopathy.  Neurological: He is alert. He has normal reflexes. No cranial nerve deficit. He exhibits normal muscle tone. Coordination normal.  Skin: Skin is warm and dry. No rash noted. No erythema. No pallor.  Psychiatric: He has a normal mood and affect.          Assessment & Plan:

## 2011-07-31 NOTE — Assessment & Plan Note (Signed)
Exp chol up  Disc low sat fat diet  Rev lifestyle  Lab today F/u 6 mo

## 2011-07-31 NOTE — Assessment & Plan Note (Signed)
bp in fair control at this time  No changes needed  Disc lifstyle change with low sodium diet and exercise   Ace refilled with [facility] today

## 2011-07-31 NOTE — Patient Instructions (Signed)
Work on weight loss- exercise 5 days per week - work up to at least 30 minutes - exercise bike Drink water/ eat better/ eat less Tdap today  Labs today  bp is good  Follow up in 6 months

## 2011-07-31 NOTE — Assessment & Plan Note (Signed)
Discussed how this problem influences overall health and the risks it imposes  Reviewed plan for weight loss with lower calorie diet (via better food choices and also portion control or program like weight watchers) and exercise building up to or more than 30 minutes 5 days per week including some aerobic activity    

## 2011-08-04 ENCOUNTER — Encounter: Payer: Self-pay | Admitting: *Deleted

## 2011-08-28 ENCOUNTER — Telehealth: Payer: Self-pay

## 2011-08-28 ENCOUNTER — Encounter: Payer: Self-pay | Admitting: Family Medicine

## 2011-08-28 ENCOUNTER — Ambulatory Visit (INDEPENDENT_AMBULATORY_CARE_PROVIDER_SITE_OTHER): Payer: BC Managed Care – PPO | Admitting: Family Medicine

## 2011-08-28 VITALS — BP 120/80 | HR 74 | Temp 97.9°F | Ht 68.0 in | Wt 227.0 lb

## 2011-08-28 DIAGNOSIS — L98499 Non-pressure chronic ulcer of skin of other sites with unspecified severity: Secondary | ICD-10-CM | POA: Insufficient documentation

## 2011-08-28 MED ORDER — VALACYCLOVIR HCL 1 G PO TABS: 1000.0000 mg | ORAL_TABLET | Freq: Three times a day (TID) | ORAL | Status: AC

## 2011-08-28 NOTE — Telephone Encounter (Signed)
For 3-4 days rash on lt eyelid; worse today with blister like spot on eyelid. Eyelid stings, no drainage. No vision problem and no itching in the eye or drainage. Pt to see Dr Milinda Antis today at 11:00 am per Dr Milinda Antis.

## 2011-08-28 NOTE — Progress Notes (Signed)
Subjective:    Patient ID: Gary Todd, male    DOB: 1965/06/03, 46 y.o.   MRN: [id number]  HPI Blister on eyelid for 3-4 days Left Is sore to the touch  Is right in crease of eyelid  No other rash on face or scalp   No fever  Feels fine Vision fine   Patient Active Problem List  Diagnoses  . HYPERLIPIDEMIA  . HYPERTENSION  . INTERNAL DERANGEMENT, RIGHT KNEE  . Abdominal  pain, other specified site  . MIGRAINES, HX OF  . Dysuria  . Hip pain  . Obese  . Exposure to STD  . Skin ulcer   Past Medical History  Diagnosis Date  . Hyperlipidemia   . Hypertension   . Migraine    Past Surgical History  Procedure Date  . Left knee surgery     ACL  . Right arm fracture - surgery 2006  . Wrist surgery    History  Substance Use Topics  . Smoking status: Never Smoker   . Smokeless tobacco: Never Used  . Alcohol Use: Yes     Occasional   Family History  Problem Relation Age of Onset  . Cancer Mother     Lung CA, smoker  . Cancer Sister     Skin CA, not melanoma   No Known Allergies Current Outpatient Prescriptions on File Prior to Visit  Medication Sig Dispense Refill  . lisinopril (PRINIVIL,ZESTRIL) 20 MG tablet Take 1 tablet (20 mg total) by mouth daily.  90 tablet  3  . traMADol (ULTRAM) 50 MG tablet Take 1 tablet (50 mg total) by mouth every 6 (six) hours as needed for pain. Maximum dose= 8 tablets per day  30 tablet  1       Review of Systems Review of Systems  Constitutional: Negative for fever, appetite change, fatigue and unexpected weight change.  Eyes: Negative for pain and visual disturbance.  Respiratory: Negative for cough and shortness of breath.   Cardiovascular: Negative for cp or palpitations    Gastrointestinal: Negative for nausea, diarrhea and constipation.  Genitourinary: Negative for urgency and frequency.  Skin: Negative for pallor or skin pain  Neurological: Negative for weakness, light-headedness, numbness and headaches.    Hematological: Negative for adenopathy. Does not bruise/bleed easily.  Psychiatric/Behavioral: Negative for dysphoric mood. The patient is not nervous/anxious.          Objective:   Physical Exam  Constitutional: He appears well-developed and well-nourished. No distress.  HENT:  Head: Normocephalic and atraumatic.  Right Ear: External ear normal.  Left Ear: External ear normal.  Nose: Nose normal.  Mouth/Throat: Oropharynx is clear and moist.  Eyes: Conjunctivae and EOM are normal. Pupils are equal, round, and reactive to light. Right eye exhibits no discharge. Left eye exhibits no discharge. No scleral icterus.       2-3 mm skin ulcer on R eyelid - is slt erythematous with a white base  No other lesions No discharge or pus nontender  Neck: Normal range of motion. Neck supple. No thyromegaly present.  Cardiovascular: Normal rate and regular rhythm.   Pulmonary/Chest: Effort normal and breath sounds normal. No respiratory distress. He has no wheezes.  Lymphadenopathy:    He has no cervical adenopathy.  Neurological: He is alert. He has normal reflexes.  Skin: Skin is warm and dry. No erythema. No pallor.       Skin ulcer on eyelid see eye exam   Psychiatric: He has a normal mood and  affect.          Assessment & Plan:

## 2011-08-28 NOTE — Patient Instructions (Signed)
I think you have a herpes simplex lesion on eyelid - this does not look like shingles but if it becomes larger/ more painful/ swollen/ additional lesions/ or vision change- call our number to talk to the nurse immediately  Keep the area clean and dry - and try not to touch  Take the valtrex as directed - and keep [place] updated

## 2011-08-30 NOTE — Assessment & Plan Note (Signed)
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

## 2011-12-18 ENCOUNTER — Ambulatory Visit (INDEPENDENT_AMBULATORY_CARE_PROVIDER_SITE_OTHER): Payer: BC Managed Care – PPO | Admitting: Family Medicine

## 2011-12-18 ENCOUNTER — Ambulatory Visit (INDEPENDENT_AMBULATORY_CARE_PROVIDER_SITE_OTHER)
Admission: RE | Admit: 2011-12-18 | Discharge: 2011-12-18 | Disposition: A | Discharge status: Home / Self Care | Payer: BC Managed Care – PPO | Source: Ambulatory Visit | Attending: Family Medicine | Admitting: Family Medicine

## 2011-12-18 ENCOUNTER — Encounter: Payer: Self-pay | Admitting: Family Medicine

## 2011-12-18 VITALS — BP 102/60 | HR 93 | Temp 97.9°F | Wt 222.0 lb

## 2011-12-18 DIAGNOSIS — M25569 Pain in unspecified knee: Secondary | ICD-10-CM

## 2011-12-18 DIAGNOSIS — M25561 Pain in right knee: Secondary | ICD-10-CM

## 2011-12-18 NOTE — Progress Notes (Signed)
Nature conservation officer at [facility] [address] [place] [address] Phone: [phone] Fax: [phone]  Date:  12/18/2011   Name:  Gary Todd   DOB:  August 02, 1965   MRN:  [id number]  PCP:  Roxy Manns, MD    Chief Complaint: Knee Pain   History of Present Illness:  Gary Todd is a 46 y.o. very pleasant male patient who presents with the following:  S/p L Knee ACL reconstruction approx 15 years prior and 3 arthroscopies all on the L:  Felt a little sore, occ will give way if he steps wrong, when puts weight on it and will have some anterior pain. And laterally.   Patient presents with 2 day h/o R sided knee pain after no occult injury. No audible pop was heard. The patient has had an effusion, and initially had significant restriction of motion. No symptomatic giving-way. No mechanical clicking. Joint has not locked up. Patient has been able to walk but is limping. The patient does have mild pain going up and down stairs or rising from a seated position.   Pain location: anterior Prior Knee Surgery: above Current pain meds: none Bracing: none Occupation or school level: truck   Past Medical History, Surgical History, Social History, Family History, Problem List, Medications, and Allergies have been reviewed and updated if relevant.  Current Outpatient Prescriptions on File Prior to Visit  Medication Sig Dispense Refill  . lisinopril (PRINIVIL,ZESTRIL) 20 MG tablet Take 1 tablet (20 mg total) by mouth daily.  90 tablet  3  . traMADol (ULTRAM) 50 MG tablet Take 1 tablet (50 mg total) by mouth every 6 (six) hours as needed for pain. Maximum dose= 8 tablets per day  30 tablet  1  . valACYclovir (VALTREX) 1000 MG tablet Take 1 tablet (1,000 mg total) by mouth 3 (three) times daily.  21 tablet  0    Review of Systems:  GEN: No fevers, chills. Nontoxic. Primarily MSK c/o today. MSK: Detailed in the HPI GI: tolerating PO intake without difficulty Neuro: No  numbness, parasthesias, or tingling associated. Otherwise the pertinent positives of the ROS are noted above.    Physical Examination: Filed Vitals:   12/18/11 1406  BP: 102/60  Pulse: 93  Temp: 97.9 F (36.6 C)   Filed Vitals:   12/18/11 1406  Weight: 222 lb (100.699 kg)   There is no height on file to calculate BMI. Ideal Body Weight:     GEN: WDWN, NAD, Non-toxic, Alert & Oriented x 3 HEENT: Atraumatic, Normocephalic.  Ears and Nose: No external deformity. EXTR: No clubbing/cyanosis/edema NEURO: Normal gait.  PSYCH: Normally interactive. Conversant. Not depressed or anxious appearing.  Calm demeanor.   Knee:  R Gait: Normal heel toe pattern ROM: 0-125 Effusion: moderate Echymosis or edema: none Patellar tendon NT Painful PLICA: neg Patellar grind: negative Medial and lateral patellar facet loading: negative medial and lateral joint lines:NT Mcmurray's neg Flexion-pinch neg Varus and valgus stress: stable Lachman: neg Ant and Post drawer: neg Hip abduction, IR, ER: WNL Hip flexion str: 5/5 Hip abd: 5/5 Quad: 5/5 VMO atrophy:No Hamstring concentric and eccentric: 5/5   Assessment and Plan: 1. Right knee pain  DG Knee Bilateral Standing AP, DG Knee 1-2 Views Right    Likely PF irritation at the distal pole. Improving a lot over 2 days. Motrin 800 mg, tid, ice  F/u prn in 3-4 weeks if not better  Dg Knee 1-2 Views Right  12/18/2011  *RADIOLOGY REPORT*  Clinical Data: Right knee pain.  RIGHT KNEE - 1-2 VIEW  Comparison: None.  Findings: Anatomic alignment on the lateral view.  There is no frontal view submitted for interpretation.  Sunrise view appears within normal limits. The patella is located.  There is a tiny marginal osteophyte off the lateral aspect of the patella.  No effusion identified on the lateral view.  IMPRESSION: No acute abnormality.  Mild patellofemoral osteoarthritis.  Original Report Authenticated By: Andreas Newport, M.D.   Dg Knee Bilateral  Standing Ap  12/18/2011  *RADIOLOGY REPORT*  Clinical Data: The right knee pain.  BILATERAL KNEES STANDING - 1 VIEW  Comparison: Lateral and sunrise views of the right knee performed today.  Findings: AP view of the knees bilaterally.  Joint space narrowing within the lateral compartment of the left knee with spurring. Right knee unremarkable. No acute bony abnormality.  Specifically, no fracture, subluxation, or dislocation.  Soft tissues are intact. Postoperative changes in the distal left femur.  IMPRESSION: Degenerative changes in the left knee. No acute bony abnormality.  Original Report Authenticated By: Cyndie Chime, M.D.    Hannah Beat, MD

## 2012-02-01 ENCOUNTER — Ambulatory Visit: Payer: BC Managed Care – PPO | Admitting: Family Medicine

## 2012-02-01 DIAGNOSIS — Z0289 Encounter for other administrative examinations: Secondary | ICD-10-CM

## 2012-08-03 ENCOUNTER — Telehealth: Payer: Self-pay | Admitting: Family Medicine

## 2012-08-03 MED ORDER — LISINOPRIL 20 MG PO TABS: 20.0000 mg | ORAL_TABLET | Freq: Every day | ORAL | Status: DC

## 2012-08-03 NOTE — Telephone Encounter (Signed)
appt scheduled for 09/07/12 and med refilled

## 2012-08-03 NOTE — Telephone Encounter (Signed)
Patient needs a refill on his Lisinopril 20mg .  He knows she wanted him to come in to be seen before a refill but he has been out of his medicine for several weeks and has not been able to come in yet.     [facility] [place]

## 2012-08-03 NOTE — Telephone Encounter (Signed)
Please schedule a f/u appt and refil until then, thanks

## 2012-08-03 NOTE — Telephone Encounter (Signed)
See prev note, ok to refill?, please adivse

## 2012-09-07 ENCOUNTER — Ambulatory Visit: Payer: BC Managed Care – PPO | Admitting: Family Medicine

## 2012-09-14 ENCOUNTER — Ambulatory Visit (INDEPENDENT_AMBULATORY_CARE_PROVIDER_SITE_OTHER): Payer: BC Managed Care – PPO | Admitting: Family Medicine

## 2012-09-14 ENCOUNTER — Encounter: Payer: Self-pay | Admitting: Family Medicine

## 2012-09-14 VITALS — BP 130/86 | HR 81 | Temp 98.6°F | Ht 68.0 in | Wt 222.0 lb

## 2012-09-14 DIAGNOSIS — I10 Essential (primary) hypertension: Secondary | ICD-10-CM

## 2012-09-14 DIAGNOSIS — E785 Hyperlipidemia, unspecified: Secondary | ICD-10-CM

## 2012-09-14 DIAGNOSIS — E669 Obesity, unspecified: Secondary | ICD-10-CM

## 2012-09-14 LAB — LIPID PANEL
Cholesterol: 270 mg/dL — ABNORMAL HIGH (ref 0–200)
HDL: 34.7 mg/dL — ABNORMAL LOW (ref >39.00)
VLDL: 48 mg/dL — ABNORMAL HIGH (ref 0.0–40.0)

## 2012-09-14 LAB — LDL CHOLESTEROL, DIRECT: Direct LDL: 188.2 mg/dL

## 2012-09-14 LAB — COMPREHENSIVE METABOLIC PANEL WITH GFR
ALT: 43 U/L (ref 0–53)
AST: 30 U/L (ref 0–37)
Albumin: 4.3 g/dL (ref 3.5–5.2)
Alkaline Phosphatase: 76 U/L (ref 39–117)
BUN: 13 mg/dL (ref 6–23)
CO2: 26 meq/L (ref 19–32)
Calcium: 9.1 mg/dL (ref 8.4–10.5)
Chloride: 106 meq/L (ref 96–112)
Creatinine, Ser: 1.2 mg/dL (ref 0.4–1.5)
GFR: 71.84 mL/min (ref >60.00)
Glucose, Bld: 108 mg/dL — ABNORMAL HIGH (ref 70–99)
Potassium: 4 meq/L (ref 3.5–5.1)
Sodium: 137 meq/L (ref 135–145)
Total Bilirubin: 1.1 mg/dL (ref 0.3–1.2)
Total Protein: 7.2 g/dL (ref 6.0–8.3)

## 2012-09-14 LAB — TSH: TSH: 2.37 u[IU]/mL (ref 0.35–5.50)

## 2012-09-14 LAB — CBC WITH DIFFERENTIAL/PLATELET
Basophils Absolute: 0 10*3/uL (ref 0.0–0.1)
Eosinophils Absolute: 0.1 10*3/uL (ref 0.0–0.7)
Hemoglobin: 16.5 g/dL (ref 13.0–17.0)
Lymphocytes Relative: 28.4 % (ref 12.0–46.0)
MCHC: 34.5 g/dL (ref 30.0–36.0)
Monocytes Relative: 9.2 % (ref 3.0–12.0)
Neutro Abs: 3.8 10*3/uL (ref 1.4–7.7)
Neutrophils Relative %: 60 % (ref 43.0–77.0)
RDW: 12.8 % (ref 11.5–14.6)

## 2012-09-14 MED ORDER — LISINOPRIL 20 MG PO TABS: 20.0000 mg | ORAL_TABLET | Freq: Every day | ORAL | Status: DC

## 2012-09-14 NOTE — Assessment & Plan Note (Signed)
Very high at last check  Has made a diet effort  Re check today Consider statin if not imp  Rev low fat diet and handout given

## 2012-09-14 NOTE — Assessment & Plan Note (Signed)
bp in fair control at this time  No changes needed  Disc lifstyle change with low sodium diet and exercise  Refilled ace

## 2012-09-14 NOTE — Patient Instructions (Signed)
Keep up the good work with healthy diet and exercise and weight loss  Labs today No change in medicine I sent your medicine to the pharmacy

## 2012-09-14 NOTE — Progress Notes (Signed)
Subjective:    Patient ID: Gary Todd, male    DOB: Mar 16, 1966, 47 y.o.   MRN: [id number]  HPI Here for f/u of chronic medical problems  Is doing fine overall   Wt is stable from last visit bmi 33  He gained and then lost - gained and lost 6 lb  More active -- [facility] [employer] [facility] per [facility] , and plays BB Trying to eat better - has cut out fried foods and decreased eating out (prep his own meals)  Lot of stress--he has both teenage boys during the school year  Separated from his wife Was sluggish - and now doing better  Recently met someone new   bp is stable today  No cp or palpitations or headaches or edema  No side effects to medicines  BP Readings from Last 3 Encounters:  09/14/12 130/86  12/18/11 102/60  08/28/11 120/80    He checks at home 117-120/mid 70s - is really good   Hyperlipidemia Lost to f/u for 6 mo Lab Results  Component Value Date   CHOL 304* 07/31/2011   HDL 40.40 07/31/2011   LDLDIRECT 188.4 07/31/2011   TRIG 297.0* 07/31/2011   CHOLHDL 8 07/31/2011   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 Skin ulcer 08/28/2011  . Obese 07/31/2011  . Exposure to STD 07/31/2011  . Hip pain 04/01/2011  . Dysuria 03/23/2011  . INTERNAL DERANGEMENT, RIGHT KNEE 08/21/2009  . Abdominal  pain, other specified site 06/19/2009  . HYPERLIPIDEMIA 01/27/2007  . HYPERTENSION 01/27/2007  . MIGRAINES, HX OF 01/27/2007   Past Medical History  Diagnosis Date  . Hyperlipidemia   . Hypertension   . Migraine    Past Surgical History  Procedure Laterality Date  . Left knee surgery      ACL  . Right arm fracture - surgery  2006  . Wrist surgery     History  Substance Use Topics  . Smoking status: Never Smoker   . Smokeless tobacco: Never Used  . Alcohol Use: Yes     Comment: Occasional   Family History  Problem Relation Age of Onset  . Cancer  Mother     Lung CA, smoker  . Cancer Sister     Skin CA, not melanoma   No Known Allergies Current Outpatient Prescriptions on File Prior to Visit  Medication Sig Dispense Refill  . lisinopril (PRINIVIL,ZESTRIL) 20 MG tablet Take 1 tablet (20 mg total) by mouth daily.  90 tablet  1   No current facility-administered medications on file prior to visit.     Review of Systems Review of Systems  Constitutional: Negative for fever, appetite change,  and unexpected weight change.pos for generalized fatigue  Eyes: Negative for pain and visual disturbance.  Respiratory: Negative for cough and shortness of breath.   Cardiovascular: Negative for cp or palpitations    Gastrointestinal: Negative for nausea, diarrhea and constipation.  Genitourinary: Negative for urgency and frequency.  Skin: Negative for pallor or rash   Neurological: Negative for weakness, light-headedness, numbness and headaches.  Hematological: Negative for adenopathy. Does not bruise/bleed easily.  Psychiatric/Behavioral: Negative for dysphoric mood. The patient is not nervous/anxious.         Objective:   Physical Exam  Constitutional: He appears well-developed and well-nourished. No distress.  HENT:  Head: Normocephalic and atraumatic.  Mouth/Throat: Oropharynx is clear and moist.  Eyes: Conjunctivae and EOM are normal. Pupils are equal, round, and reactive to light. Right eye exhibits no discharge. Left eye exhibits no discharge. No scleral icterus.  Neck: Normal range of motion. Neck supple. No JVD present. Carotid bruit is not present. No thyromegaly present.  Cardiovascular: Normal rate, regular rhythm, normal heart sounds and intact distal pulses.  Exam reveals no gallop.   No murmur heard. Pulmonary/Chest: Effort normal and breath sounds normal. No respiratory distress. He has no wheezes. He has no rales.  Abdominal: Soft. Bowel sounds are normal. He exhibits no distension and no mass. There is no tenderness.   Musculoskeletal: He exhibits no edema and no tenderness.  Lymphadenopathy:    He has no cervical adenopathy.  Neurological: He is alert. He has normal reflexes. No cranial nerve deficit. He exhibits normal muscle tone. Coordination normal.  Skin: Skin is warm and dry. No rash noted. No erythema. No pallor.  Psychiatric: He has a normal mood and affect.          Assessment & Plan:

## 2012-09-14 NOTE — Assessment & Plan Note (Signed)
.  Discussed how this problem influences overall health and the risks it imposes  Reviewed plan for weight loss with lower calorie diet (via better food choices and also portion control or program like weight watchers) and exercise building up to or more than 30 minutes 5 days per week including some aerobic activity   Commended effort so far  

## 2012-11-10 ENCOUNTER — Telehealth: Payer: Self-pay | Admitting: Family Medicine

## 2012-11-10 NOTE — Telephone Encounter (Signed)
Patient called back in and Rose relayed the message that UC was recommended.

## 2012-11-10 NOTE — Telephone Encounter (Signed)
Patient Information:  Caller Name: Holdyn  Phone: [phone]  Patient: Gary Todd, [place]  Gender: Male  DOB: Dec 14, 1965  Age: 47 Years  PCP: [facility], [place] (Family Practice)  Office Follow Up:  Does the office need to follow up with this patient?: Yes  Instructions For The Office: See symptoms, large solid red area at tick bite, no appts available - pt advise pt (if appt can be made - pt is out of town and will be back after 1pm today)   Symptoms  Reason For Call & Symptoms: Tick bite on 6/19 or 11/04/12, tick was removed from right side of belt line..  11/10/12 solid red splotchy area about size of silver dollar where bite was, not painful, but sore to touch.  Afebrile.  Pt saved tick.  Reviewed Health History In EMR: Yes  Reviewed Medications In EMR: Yes  Reviewed Allergies In EMR: Yes  Reviewed Surgeries / Procedures: Yes  Date of Onset of Symptoms: 11/03/2012  Guideline(s) Used:  Tick Bite  Disposition Per Guideline:   Home Care  Reason For Disposition Reached:   Tick bite with no complications  Advice Given:  Call Back If:  Fever or rash occur in the next 2 weeks  You become worse.  RN Overrode Recommendation:  Make Appointment  per nursing judgement

## 2012-11-10 NOTE — Telephone Encounter (Signed)
Needs eval, rec UC. Thanks.

## 2012-11-11 ENCOUNTER — Ambulatory Visit (INDEPENDENT_AMBULATORY_CARE_PROVIDER_SITE_OTHER): Payer: BC Managed Care – PPO | Admitting: Family Medicine

## 2012-11-11 ENCOUNTER — Encounter: Payer: Self-pay | Admitting: Family Medicine

## 2012-11-11 VITALS — BP 102/68 | HR 75 | Temp 97.4°F | Wt 217.2 lb

## 2012-11-11 DIAGNOSIS — W57XXXA Bitten or stung by nonvenomous insect and other nonvenomous arthropods, initial encounter: Secondary | ICD-10-CM

## 2012-11-11 DIAGNOSIS — T148 Other injury of unspecified body region: Secondary | ICD-10-CM

## 2012-11-11 MED ORDER — DOXYCYCLINE HYCLATE 100 MG PO TABS: 100.0000 mg | ORAL_TABLET | Freq: Two times a day (BID) | ORAL | Status: DC

## 2012-11-11 NOTE — Patient Instructions (Signed)
I would start the doxycycline today.  This should get better.  Given [place] an update next week. Take care.  Sun exposures caution.

## 2012-11-11 NOTE — Progress Notes (Signed)
Tick bite last week.  It "backed out" when he put nail polish on it. He was feeling fine at that point.  The tick was engorged.  Now with a rash noted 2 days ago.  No FCNAVD.  No other rash.  No new joint aches, h/o knee pain at baseline.  He feels fine except for the rash.    Meds, vitals, and allergies reviewed.   ROS: See HPI.  Otherwise, noncontributory.  nad ncat Mmm rrr ctab No rash except for 5cm R lower abd wall blanching pink irregular rash with possible central clearing.

## 2012-11-13 DIAGNOSIS — W57XXXA Bitten or stung by nonvenomous insect and other nonvenomous arthropods, initial encounter: Secondary | ICD-10-CM | POA: Insufficient documentation

## 2012-11-13 NOTE — Assessment & Plan Note (Signed)
Nontoxic, Likely STARI, use doxy with sun caution and f/u prn.  No sx other than the rash and okay for outpatient f/u.  He agrees.

## 2013-03-31 ENCOUNTER — Other Ambulatory Visit: Payer: Self-pay | Admitting: Family Medicine

## 2013-03-31 MED ORDER — LISINOPRIL 20 MG PO TABS: 20.0000 mg | ORAL_TABLET | Freq: Every day | ORAL | Status: DC

## 2013-04-03 ENCOUNTER — Other Ambulatory Visit: Payer: Self-pay

## 2013-04-03 MED ORDER — LISINOPRIL 20 MG PO TABS: 20.0000 mg | ORAL_TABLET | Freq: Every day | ORAL | Status: DC

## 2013-04-03 NOTE — Telephone Encounter (Signed)
Pt said 03/31/13 [facility] and [facility]

## 2014-06-12 ENCOUNTER — Other Ambulatory Visit: Payer: Self-pay | Admitting: *Deleted

## 2014-06-12 ENCOUNTER — Telehealth: Payer: Self-pay | Admitting: *Deleted

## 2014-06-12 MED ORDER — LISINOPRIL 20 MG PO TABS: 20.0000 mg | ORAL_TABLET | Freq: Every day | ORAL | Status: DC

## 2014-06-12 NOTE — Telephone Encounter (Signed)
Patient left voicemail requesting refill. Ok to refill for 30 days? Schedule appt.on 06/18/2014 but have not been seen since 09/14/2012.

## 2014-06-12 NOTE — Telephone Encounter (Signed)
For lisinopril 20 mg

## 2014-06-12 NOTE — Telephone Encounter (Signed)
Duplicate encouter

## 2014-06-12 NOTE — Telephone Encounter (Signed)
Please refill for 30 days, thanks

## 2014-06-12 NOTE — Telephone Encounter (Signed)
Sent Rx and inform patient.

## 2014-06-18 ENCOUNTER — Ambulatory Visit (INDEPENDENT_AMBULATORY_CARE_PROVIDER_SITE_OTHER): Payer: BLUE CROSS/BLUE SHIELD | Admitting: Family Medicine

## 2014-06-18 ENCOUNTER — Encounter: Payer: Self-pay | Admitting: Family Medicine

## 2014-06-18 VITALS — BP 108/68 | HR 74 | Temp 98.2°F | Ht 68.0 in | Wt 224.8 lb

## 2014-06-18 DIAGNOSIS — B001 Herpesviral vesicular dermatitis: Secondary | ICD-10-CM

## 2014-06-18 DIAGNOSIS — E669 Obesity, unspecified: Secondary | ICD-10-CM

## 2014-06-18 DIAGNOSIS — I10 Essential (primary) hypertension: Secondary | ICD-10-CM

## 2014-06-18 DIAGNOSIS — E785 Hyperlipidemia, unspecified: Secondary | ICD-10-CM

## 2014-06-18 MED ORDER — VALACYCLOVIR HCL 1 G PO TABS: ORAL_TABLET | ORAL | Status: AC

## 2014-06-18 MED ORDER — LISINOPRIL 20 MG PO TABS: 20.0000 mg | ORAL_TABLET | Freq: Every day | ORAL | Status: DC

## 2014-06-18 NOTE — Assessment & Plan Note (Signed)
bp in fair control at this time  BP Readings from Last 1 Encounters:  06/18/14 108/68   No changes needed Disc lifstyle change with low sodium diet and exercise  Overdue for labs  Lab today  Refilled ace

## 2014-06-18 NOTE — Patient Instructions (Signed)
Treat cold sores early with valtrex Watch diet for cholesterol   (Avoid red meat/ fried foods/ egg yolks/ fatty breakfast meats/ butter, cheese and high fat dairy/ and shellfish ) Start to work on an exercise program We will schedule follow up based on labs     Fat and Cholesterol Control Diet Fat and cholesterol levels in your blood and organs are influenced by your diet. High levels of fat and cholesterol may lead to diseases of the heart, small and large blood vessels, gallbladder, liver, and pancreas. CONTROLLING FAT AND CHOLESTEROL WITH DIET Although exercise and lifestyle factors are important, your diet is key. That is because certain foods are known to raise cholesterol and others to lower it. The goal is to balance foods for their effect on cholesterol and more importantly, to replace saturated and trans fat with other types of fat, such as monounsaturated fat, polyunsaturated fat, and omega-3 fatty acids. On average, a person should consume no more than 15 to 17 g of saturated fat daily. Saturated and trans fats are considered "bad" fats, and they will raise LDL cholesterol. Saturated fats are primarily found in animal products such as meats, butter, and cream. However, that does not mean you need to give up all your favorite foods. Today, there are good tasting, low-fat, low-cholesterol substitutes for most of the things you like to eat. Choose low-fat or nonfat alternatives. Choose round or loin cuts of red meat. These types of cuts are lowest in fat and cholesterol. Chicken (without the skin), fish, veal, and ground [place] breast are great choices. Eliminate fatty meats, such as hot dogs and salami. Even shellfish have little or no saturated fat. Have a 3 oz (85 g) portion when you eat lean meat, poultry, or fish. Trans fats are also called "partially hydrogenated oils." They are oils that have been scientifically manipulated so that they are solid at room temperature resulting in a longer  shelf life and improved taste and texture of foods in which they are added. Trans fats are found in stick margarine, some tub margarines, cookies, crackers, and baked goods.  When baking and cooking, oils are a great substitute for butter. The monounsaturated oils are especially beneficial since it is believed they lower LDL and raise HDL. The oils you should avoid entirely are saturated tropical oils, such as coconut and palm.  Remember to eat a lot from food groups that are naturally free of saturated and trans fat, including fish, fruit, vegetables, beans, grains (barley, rice, couscous, bulgur wheat), and pasta (without cream sauces).  IDENTIFYING FOODS THAT LOWER FAT AND CHOLESTEROL  Soluble fiber may lower your cholesterol. This type of fiber is found in fruits such as apples, vegetables such as broccoli, potatoes, and carrots, legumes such as beans, peas, and lentils, and grains such as barley. Foods fortified with plant sterols (phytosterol) may also lower cholesterol. You should eat at least 2 g per day of these foods for a cholesterol lowering effect.  Read package labels to identify low-saturated fats, trans fat free, and low-fat foods at the supermarket. Select cheeses that have only 2 to 3 g saturated fat per ounce. Use a heart-healthy tub margarine that is free of trans fats or partially hydrogenated oil. When buying baked goods (cookies, crackers), avoid partially hydrogenated oils. Breads and muffins should be made from whole grains (whole-wheat or whole oat flour, instead of "flour" or "enriched flour"). Buy non-creamy canned soups with reduced salt and no added fats.  FOOD PREPARATION TECHNIQUES  Never deep-fry. If you must fry, either stir-fry, which uses very little fat, or use non-stick cooking sprays. When possible, broil, bake, or roast meats, and steam vegetables. Instead of putting butter or margarine on vegetables, use lemon and herbs, applesauce, and cinnamon (for squash and sweet  potatoes). Use nonfat yogurt, salsa, and low-fat dressings for salads.  LOW-SATURATED FAT / LOW-FAT FOOD SUBSTITUTES Meats / Saturated Fat (g)  Avoid: Steak, marbled (3 oz/85 g) / 11 g  Choose: Steak, lean (3 oz/85 g) / 4 g  Avoid: Hamburger (3 oz/85 g) / 7 g  Choose: Hamburger, lean (3 oz/85 g) / 5 g  Avoid: Ham (3 oz/85 g) / 6 g  Choose: Ham, lean cut (3 oz/85 g) / 2.4 g  Avoid: Chicken, with skin, dark meat (3 oz/85 g) / 4 g  Choose: Chicken, skin removed, dark meat (3 oz/85 g) / 2 g  Avoid: Chicken, with skin, light meat (3 oz/85 g) / 2.5 g  Choose: Chicken, skin removed, light meat (3 oz/85 g) / 1 g Dairy / Saturated Fat (g)  Avoid: Whole milk (1 cup) / 5 g  Choose: Low-fat milk, 2% (1 cup) / 3 g  Choose: Low-fat milk, 1% (1 cup) / 1.5 g  Choose: Skim milk (1 cup) / 0.3 g  Avoid: Hard cheese (1 oz/28 g) / 6 g  Choose: Skim milk cheese (1 oz/28 g) / 2 to 3 g  Avoid: Cottage cheese, 4% fat (1 cup) / 6.5 g  Choose: Low-fat cottage cheese, 1% fat (1 cup) / 1.5 g  Avoid: Ice cream (1 cup) / 9 g  Choose: Sherbet (1 cup) / 2.5 g  Choose: Nonfat frozen yogurt (1 cup) / 0.3 g  Choose: Frozen fruit bar / trace  Avoid: Whipped cream (1 tbs) / 3.5 g  Choose: Nondairy whipped topping (1 tbs) / 1 g Condiments / Saturated Fat (g)  Avoid: Mayonnaise (1 tbs) / 2 g  Choose: Low-fat mayonnaise (1 tbs) / 1 g  Avoid: Butter (1 tbs) / 7 g  Choose: Extra light margarine (1 tbs) / 1 g  Avoid: Coconut oil (1 tbs) / 11.8 g  Choose: Olive oil (1 tbs) / 1.8 g  Choose: Corn oil (1 tbs) / 1.7 g  Choose: Safflower oil (1 tbs) / 1.2 g  Choose: Sunflower oil (1 tbs) / 1.4 g  Choose: Soybean oil (1 tbs) / 2.4 g  Choose: Canola oil (1 tbs) / 1 g Document Released: 05/04/2005 Document Revised: 08/29/2012 Document Reviewed: 08/02/2013 Exit[employer] Patient Information 2015 [place], [place]. This information is not intended to replace advice given to you by your health care  provider. Make sure you discuss any questions you have with your health care provider.

## 2014-06-18 NOTE — Progress Notes (Signed)
Subjective:    Patient ID: Gary Todd, male    DOB: February 22, 1966, 49 y.o.   MRN: [id number]  HPI Here for follow up for chronic illnesses   Since last visit -divorced and re married  Life is better  Much stress- financial - getting through it  Things will calm down now  Working a lot   [employer] Financial[employer]t is up 7 lb (had lost wt and gained it back)  Not taking care of himself  He is ready to start changing health habits  Getting the junk food out of the house and going to focus on more fresh/ grains/veg  A little more time upcoming   Has P90 X program  Plans to start that with his wife  He was without bp med for 2-3 months - bp did go up  We px it -back on it -needs labs done (last in 2014)  Cholesterol was quite high - declined medicine  Needs re check  Wants to see the difference before and after better diet    More cold sores lately  More with stress Uses abreeva  Interested in px tx     Patient Active Problem List   Diagnosis Date Noted  . Tick bite 11/13/2012  . Obese 07/31/2011  . Hip pain 04/01/2011  . INTERNAL DERANGEMENT, RIGHT KNEE 08/21/2009  . Abdominal  pain, other specified site 06/19/2009  . Hyperlipidemia 01/27/2007  . Essential hypertension 01/27/2007  . MIGRAINES, HX OF 01/27/2007   Past Medical History  Diagnosis Date  . Hyperlipidemia   . Hypertension   . Migraine    Past Surgical History  Procedure Laterality Date  . Left knee surgery      ACL  . Right arm fracture - surgery  2006  . Wrist surgery     History  Substance Use Topics  . Smoking status: Never Smoker   . Smokeless tobacco: Never Used  . Alcohol Use: 0.0 oz/week    0 Not specified per week     Comment: Occasional   Family History  Problem Relation Age of Onset  . Cancer Mother     Lung CA, smoker  . Cancer Sister     Skin CA, not melanoma   No Known Allergies Current Outpatient Prescriptions on File Prior to Visit  Medication Sig Dispense Refill  . aspirin 81 MG tablet  Take 81 mg by mouth daily.    Marland Kitchen. lisinopril (PRINIVIL,ZESTRIL) 20 MG tablet Take 1 tablet (20 mg total) by mouth daily. 30 tablet 0   No current facility-administered medications on file prior to visit.      Review of Systems Review of Systems  Constitutional: Negative for fever, appetite change, fatigue and unexpected weight change.  Eyes: Negative for pain and visual disturbance.  Respiratory: Negative for cough and shortness of breath.   Cardiovascular: Negative for cp or palpitations    Gastrointestinal: Negative for nausea, diarrhea and constipation.  Genitourinary: Negative for urgency and frequency.  Skin: Negative for pallor or rash  pos for recurrent cold sores  Neurological: Negative for weakness, light-headedness, numbness and headaches.  Hematological: Negative for adenopathy. Does not bruise/bleed easily.  Psychiatric/Behavioral: Negative for dysphoric mood. The patient is not nervous/anxious.         Objective:   Physical Exam  Constitutional: He appears well-developed and well-nourished. No distress.  obese and well appearing   HENT:  Head: Normocephalic and atraumatic.  Mouth/Throat: Oropharynx is clear and moist.  Eyes: Conjunctivae and EOM are  normal. Pupils are equal, round, and reactive to light. No scleral icterus.  Neck: Normal range of motion. Neck supple. No JVD present. Carotid bruit is not present. No thyromegaly present.  Cardiovascular: Normal rate, regular rhythm, normal heart sounds and intact distal pulses.  Exam reveals no gallop.   No murmur heard. Pulmonary/Chest: Breath sounds normal. No respiratory distress. He has no wheezes. He has no rales.  Abdominal: Soft. Bowel sounds are normal. He exhibits no distension and no abdominal bruit. There is no tenderness. There is no rebound.  Musculoskeletal: He exhibits no edema.  Lymphadenopathy:    He has no cervical adenopathy.  Neurological: He is alert. He has normal reflexes.  Skin: Skin is warm and  dry. No rash noted. No erythema. No pallor.  Resolving cold sore on L lat lower lip  Psychiatric: He has a normal mood and affect.          Assessment & Plan:   Problem List Items Addressed This Visit      Cardiovascular and Mediastinum   Essential hypertension - Primary    bp in fair control at this time  BP Readings from Last 1 Encounters:  06/18/14 108/68   No changes needed Disc lifstyle change with low sodium diet and exercise  Overdue for labs  Lab today  Refilled ace       Relevant Medications   lisinopril (PRINIVIL,ZESTRIL) tablet   Other Relevant Orders   CBC with Differential/Platelet   Comprehensive metabolic panel   TSH   Lipid panel     Digestive   Recurrent cold sores    Trial of valtrex 2 g bid at first sign of cold sore  Will update if no imp Refills given       Relevant Medications   valACYclovir (VALTREX) tablet     Other   Hyperlipidemia    Quite high last time Declines med  Baseline today Will work on diet- handout given/ and exercise  Will then decide when to re check       Relevant Medications   lisinopril (PRINIVIL,ZESTRIL) tablet   Other Relevant Orders   Lipid panel   Obese    Discussed how this problem influences overall health and the risks it imposes  Reviewed plan for weight loss with lower calorie diet (via better food choices and also portion control or program like weight watchers) and exercise building up to or more than 30 minutes 5 days per week including some aerobic activity

## 2014-06-18 NOTE — Assessment & Plan Note (Signed)
Discussed how this problem influences overall health and the risks it imposes  Reviewed plan for weight loss with lower calorie diet (via better food choices and also portion control or program like weight watchers) and exercise building up to or more than 30 minutes 5 days per week including some aerobic activity    

## 2014-06-18 NOTE — Progress Notes (Signed)
Pre visit review using our clinic review tool, if applicable. No additional management support is needed unless otherwise documented below in the visit note. 

## 2014-06-18 NOTE — Assessment & Plan Note (Signed)
Quite high last time Declines med  Baseline today Will work on diet- handout given/ and exercise  Will then decide when to re check

## 2014-06-18 NOTE — Assessment & Plan Note (Signed)
Trial of valtrex 2 g bid at first sign of cold sore  Will update if no imp Refills given

## 2014-06-19 ENCOUNTER — Telehealth: Payer: Self-pay | Admitting: Family Medicine

## 2014-06-19 LAB — CBC WITH DIFFERENTIAL/PLATELET
BASOS ABS: 0.1 10*3/uL (ref 0.0–0.1)
Basophils Relative: 1.5 % (ref 0.0–3.0)
EOS PCT: 1.7 % (ref 0.0–5.0)
Eosinophils Absolute: 0.1 10*3/uL (ref 0.0–0.7)
HEMATOCRIT: 46.7 % (ref 39.0–52.0)
Hemoglobin: 15.9 g/dL (ref 13.0–17.0)
Lymphocytes Relative: 27.4 % (ref 12.0–46.0)
Lymphs Abs: 2.1 10*3/uL (ref 0.7–4.0)
MCHC: 34.1 g/dL (ref 30.0–36.0)
MCV: 76.7 fl — AB (ref 78.0–100.0)
MONOS PCT: 5.6 % (ref 3.0–12.0)
Monocytes Absolute: 0.4 10*3/uL (ref 0.1–1.0)
Neutro Abs: 4.9 10*3/uL (ref 1.4–7.7)
Neutrophils Relative %: 63.8 % (ref 43.0–77.0)
Platelets: 198 10*3/uL (ref 150.0–400.0)
RBC: 6.09 Mil/uL — AB (ref 4.22–5.81)
RDW: 12.6 % (ref 11.5–15.5)
WBC: 7.6 10*3/uL (ref 4.0–10.5)

## 2014-06-19 LAB — COMPREHENSIVE METABOLIC PANEL
ALT: 35 U/L (ref 0–53)
AST: 22 U/L (ref 0–37)
Albumin: 4.2 g/dL (ref 3.5–5.2)
Alkaline Phosphatase: 82 U/L (ref 39–117)
BILIRUBIN TOTAL: 0.6 mg/dL (ref 0.2–1.2)
BUN: 16 mg/dL (ref 6–23)
CO2: 27 meq/L (ref 19–32)
CREATININE: 1.27 mg/dL (ref 0.40–1.50)
Calcium: 9.3 mg/dL (ref 8.4–10.5)
Chloride: 106 mEq/L (ref 96–112)
GFR: 64.23 mL/min (ref >60.00)
GLUCOSE: 67 mg/dL — AB (ref 70–99)
POTASSIUM: 4.2 meq/L (ref 3.5–5.1)
Sodium: 139 mEq/L (ref 135–145)
TOTAL PROTEIN: 6.6 g/dL (ref 6.0–8.3)

## 2014-06-19 LAB — LIPID PANEL
CHOL/HDL RATIO: 7
Cholesterol: 280 mg/dL — ABNORMAL HIGH (ref 0–200)
HDL: 38.8 mg/dL — ABNORMAL LOW (ref >39.00)
Triglycerides: 483 mg/dL — ABNORMAL HIGH (ref 0.0–149.0)

## 2014-06-19 LAB — TSH: TSH: 4.52 u[IU]/mL — AB (ref 0.35–4.50)

## 2014-06-19 LAB — LDL CHOLESTEROL, DIRECT: Direct LDL: 165 mg/dL

## 2014-06-19 NOTE — Telephone Encounter (Signed)
emmi emailed °

## 2014-06-21 ENCOUNTER — Encounter: Payer: Self-pay | Admitting: *Deleted

## 2014-06-25 ENCOUNTER — Encounter: Payer: Self-pay | Admitting: *Deleted

## 2014-06-26 ENCOUNTER — Encounter: Payer: Self-pay | Admitting: *Deleted

## 2014-07-10 IMAGING — CR DG KNEE 1-2V*R*
2 series · 2 of 2 positions shown · non-contrast
Comparison: None.

CLINICAL DATA: Right knee pain.

RIGHT KNEE - 1-2 VIEW

[view not recorded (1 of 2)]
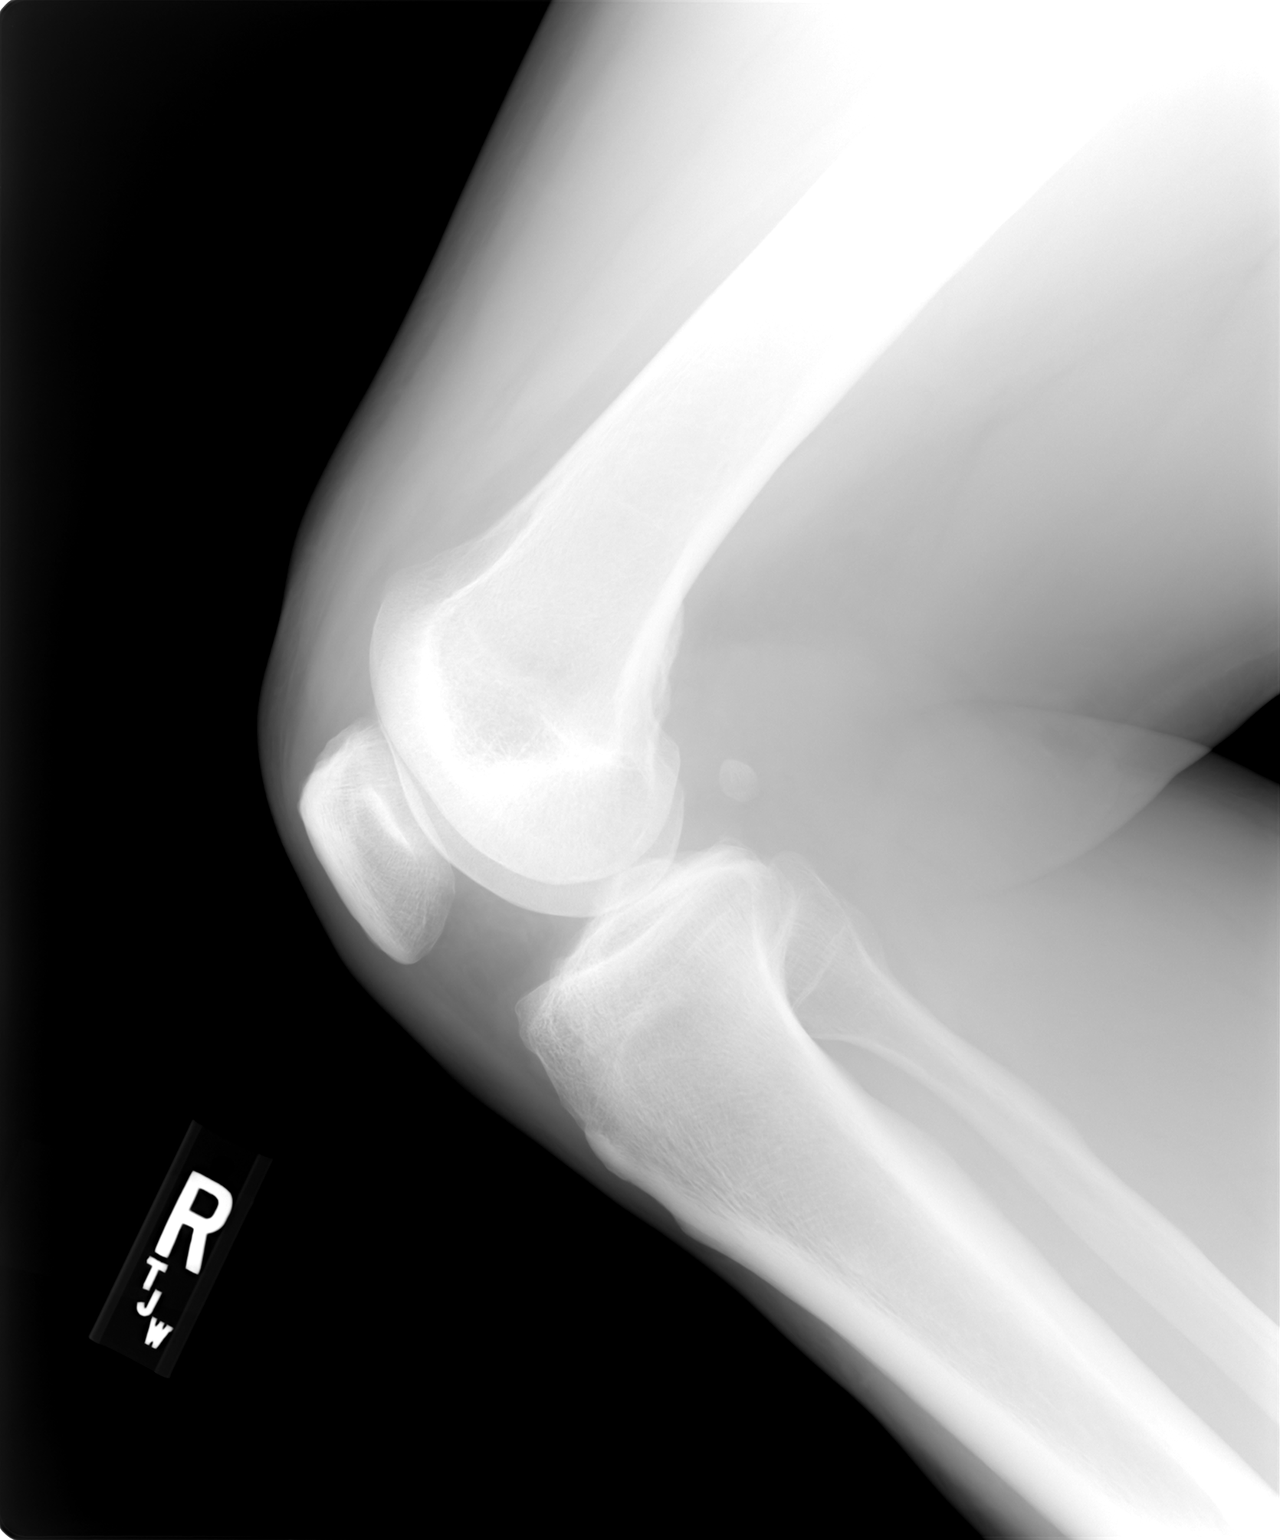

[view not recorded (2 of 2)]
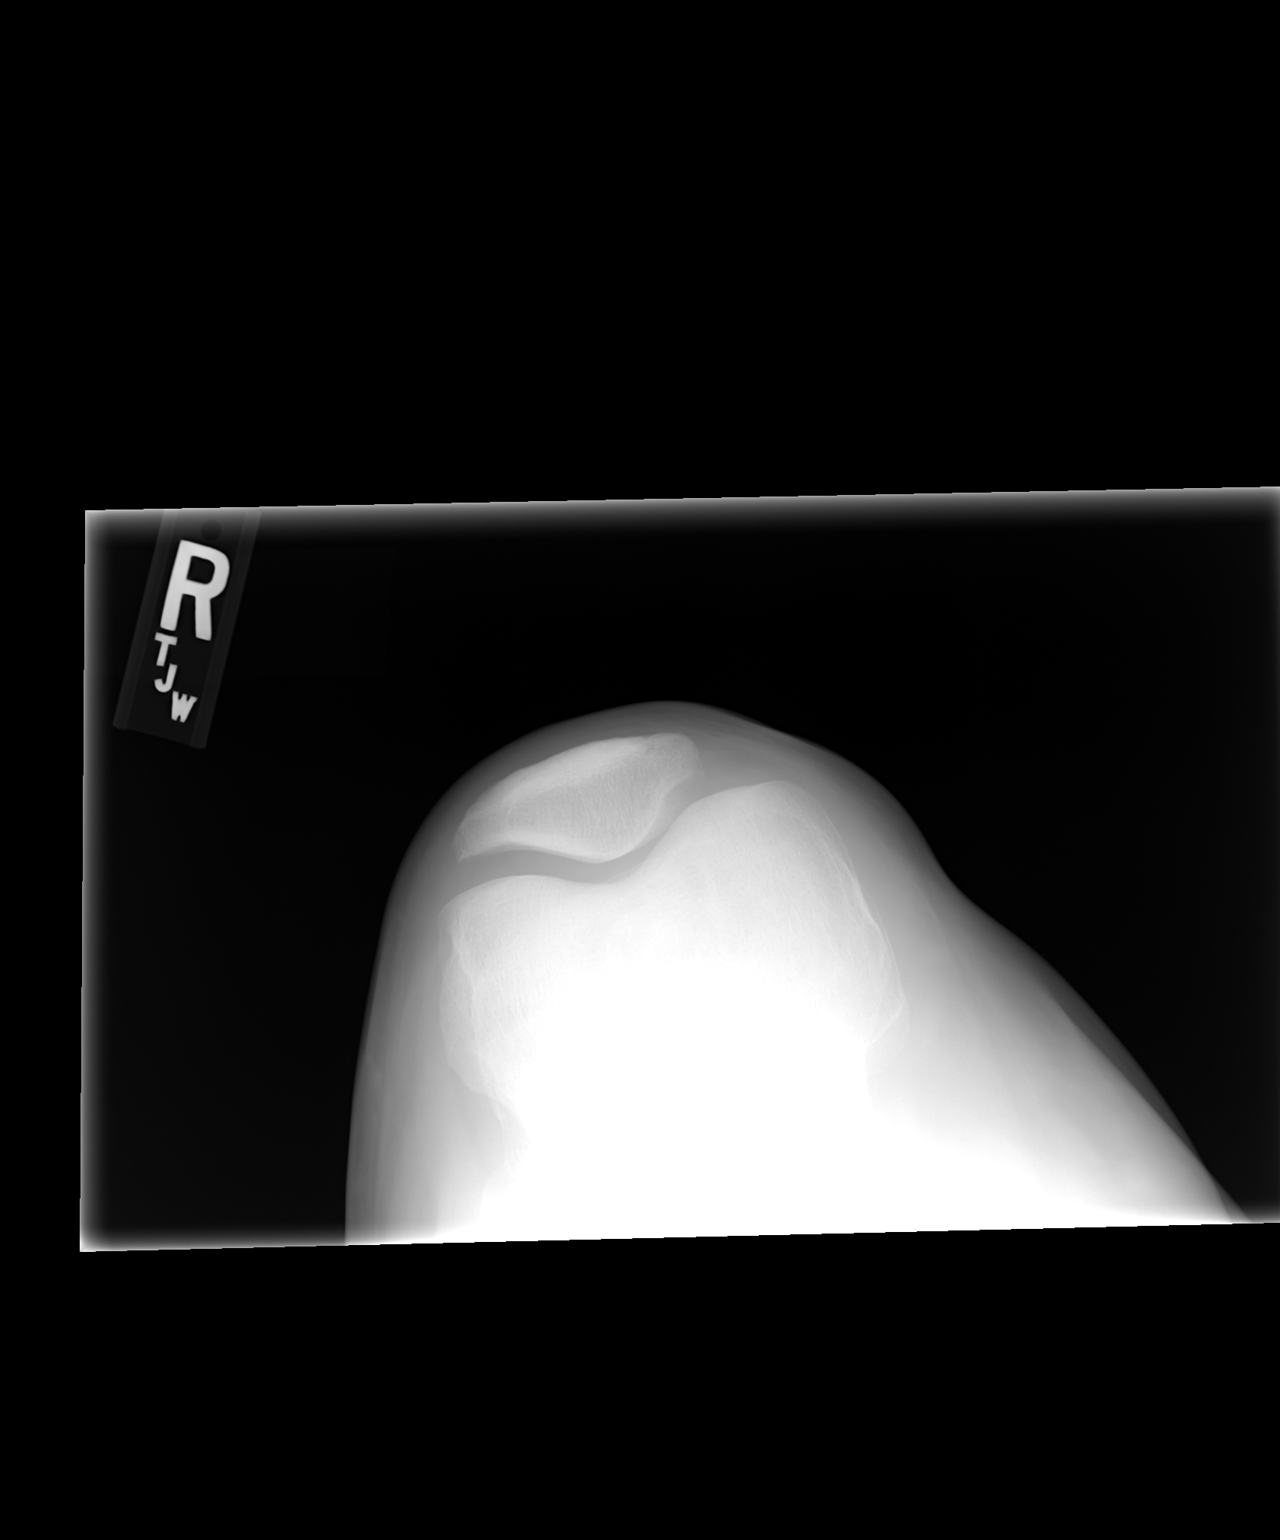

[2 of 2 positions shown; findings below may reference images not displayed]

FINDINGS: Anatomic alignment on the lateral view.  There is no
frontal view submitted for interpretation.  Sunrise view appears
within normal limits. The patella is located.  There is a tiny
marginal osteophyte off the lateral aspect of the patella.  No
effusion identified on the lateral view.
IMPRESSION: No acute abnormality.  Mild patellofemoral osteoarthritis.

## 2014-07-31 ENCOUNTER — Other Ambulatory Visit: Payer: Self-pay

## 2014-07-31 MED ORDER — LISINOPRIL 20 MG PO TABS: 20.0000 mg | ORAL_TABLET | Freq: Every day | ORAL | Status: DC

## 2014-08-07 ENCOUNTER — Ambulatory Visit (INDEPENDENT_AMBULATORY_CARE_PROVIDER_SITE_OTHER): Payer: BLUE CROSS/BLUE SHIELD | Admitting: Family Medicine

## 2014-08-07 ENCOUNTER — Encounter: Payer: Self-pay | Admitting: Family Medicine

## 2014-08-07 VITALS — BP 116/84 | HR 78 | Temp 98.5°F | Ht 68.0 in | Wt 222.0 lb

## 2014-08-07 DIAGNOSIS — J069 Acute upper respiratory infection, unspecified: Secondary | ICD-10-CM | POA: Diagnosis not present

## 2014-08-07 DIAGNOSIS — B9789 Other viral agents as the cause of diseases classified elsewhere: Principal | ICD-10-CM

## 2014-08-07 MED ORDER — BENZONATATE 200 MG PO CAPS: 200.0000 mg | ORAL_CAPSULE | Freq: Three times a day (TID) | ORAL | Status: AC | PRN

## 2014-08-07 NOTE — Assessment & Plan Note (Signed)
With cough and malaise/no fever  Disc symptomatic care - see instructions on AVS  Tessalon prn  Has codiene cough med to try at home (caution of sedation)   Update if not starting to improve in a week or if worsening   Handout given on uri

## 2014-08-07 NOTE — Patient Instructions (Signed)
The codeine cough medicine as needed for severe cough when not working or driving Try tessalon pills for cough as needed (swallow whole)  Try zyrtec otc for runny nose  Nasal saline spray for stuffy nose  Tylenol for fever if needed  Get lots of rest and fluids   Update if not starting to improve in a week or if worsening

## 2014-08-07 NOTE — Progress Notes (Signed)
Subjective:    Patient ID: Gary Todd, male    DOB: 1965-08-05, 49 y.o.   MRN: [id number]  HPI Here for 2 d of uri symptoms Cough- prod of clear mucous  Runny nose and bad post nasal drainage  Little ST from coughing  Ears pop and hurt at times   nyquil and alka selzer plus cold med helps a little   No fever  No aches   Wife gave this to him - she is getting better   Patient Active Problem List   Diagnosis Date Noted  . Recurrent cold sores 06/18/2014  . Tick bite 11/13/2012  . Obese 07/31/2011  . Hip pain 04/01/2011  . INTERNAL DERANGEMENT, RIGHT KNEE 08/21/2009  . Abdominal  pain, other specified site 06/19/2009  . Hyperlipidemia 01/27/2007  . Essential hypertension 01/27/2007  . MIGRAINES, HX OF 01/27/2007   Past Medical History  Diagnosis Date  . Hyperlipidemia   . Hypertension   . Migraine    Past Surgical History  Procedure Laterality Date  . Left knee surgery      ACL  . Right arm fracture - surgery  2006  . Wrist surgery     History  Substance Use Topics  . Smoking status: Never Smoker   . Smokeless tobacco: Never Used  . Alcohol Use: 0.0 oz/week    0 Standard drinks or equivalent per week     Comment: Occasional   Family History  Problem Relation Age of Onset  . Cancer Mother     Lung CA, smoker  . Cancer Sister     Skin CA, not melanoma   No Known Allergies Current Outpatient Prescriptions on File Prior to Visit  Medication Sig Dispense Refill  . aspirin 81 MG tablet Take 81 mg by mouth daily.    Marland Kitchen. lisinopril (PRINIVIL,ZESTRIL) 20 MG tablet Take 1 tablet (20 mg total) by mouth daily. 90 tablet 3  . valACYclovir (VALTREX) 1000 MG tablet Take 2 pills twice daily by mouth for one day at the first sign of a cold sore 8 tablet 3   No current facility-administered medications on file prior to visit.     Review of Systems Review of Systems  Constitutional: Negative for fever, appetite change,  and unexpected weight change.  ENT pos for  cong/rhinorrhea, st, neg for facial pain  Eyes: Negative for pain and visual disturbance.  Respiratory: Negative for wheeze  and shortness of breath.   Cardiovascular: Negative for cp or palpitations    Gastrointestinal: Negative for nausea, diarrhea and constipation.  Genitourinary: Negative for urgency and frequency.  Skin: Negative for pallor or rash   Neurological: Negative for weakness, light-headedness, numbness and headaches.  Hematological: Negative for adenopathy. Does not bruise/bleed easily.  Psychiatric/Behavioral: Negative for dysphoric mood. The patient is not nervous/anxious.         Objective:   Physical Exam  Constitutional: He appears well-developed and well-nourished. No distress.  obese and well appearing   HENT:  Head: Normocephalic and atraumatic.  Right Ear: External ear normal.  Left Ear: External ear normal.  Mouth/Throat: Oropharynx is clear and moist.  Nares are injected and congested  No sinus tenderness Clear rhinorrhea and post nasal drip   Eyes: Conjunctivae and EOM are normal. Pupils are equal, round, and reactive to light. Right eye exhibits no discharge. Left eye exhibits no discharge.  Neck: Normal range of motion. Neck supple.  Cardiovascular: Normal rate and normal heart sounds.   Pulmonary/Chest: Effort normal and  breath sounds normal. No respiratory distress. He has no wheezes. He has no rales. He exhibits no tenderness.  Lymphadenopathy:    He has no cervical adenopathy.  Neurological: He is alert.  Skin: Skin is warm and dry. No rash noted.  Psychiatric: He has a normal mood and affect.          Assessment & Plan:   Problem List Items Addressed This Visit      Respiratory   Viral URI with cough - Primary    With cough and malaise/no fever  Disc symptomatic care - see instructions on AVS  Tessalon prn  Has codiene cough med to try at home (caution of sedation)   Update if not starting to improve in a week or if worsening     Handout given on uri

## 2014-08-07 NOTE — Progress Notes (Signed)
Pre visit review using our clinic review tool, if applicable. No additional management support is needed unless otherwise documented below in the visit note. 

## 2014-09-13 ENCOUNTER — Telehealth: Payer: Self-pay | Admitting: Family Medicine

## 2014-09-13 DIAGNOSIS — R7989 Other specified abnormal findings of blood chemistry: Secondary | ICD-10-CM | POA: Insufficient documentation

## 2014-09-13 DIAGNOSIS — E785 Hyperlipidemia, unspecified: Secondary | ICD-10-CM

## 2014-09-13 NOTE — Telephone Encounter (Signed)
-----   Message from Alvina Chouerri J Walsh sent at 09/10/2014  4:26 PM EDT ----- Regarding: Lab orders for Friday, 4.29.16 Lab orders for f/u

## 2014-09-14 ENCOUNTER — Other Ambulatory Visit: Payer: BLUE CROSS/BLUE SHIELD

## 2014-10-16 ENCOUNTER — Other Ambulatory Visit: Payer: BLUE CROSS/BLUE SHIELD

## 2015-07-14 ENCOUNTER — Other Ambulatory Visit: Payer: Self-pay | Admitting: Family Medicine

## 2015-07-15 NOTE — Telephone Encounter (Signed)
Please plan a 30 min f/u and refill until then

## 2015-07-15 NOTE — Telephone Encounter (Signed)
Electronic refill request, no recent/future appt., please advise  

## 2015-07-16 NOTE — Telephone Encounter (Signed)
Left voicemail requesting pt to call office back 

## 2015-07-18 NOTE — Telephone Encounter (Signed)
Left 2nd voicemail requesting pt to call office back 

## 2015-08-02 MED ORDER — LISINOPRIL 20 MG PO TABS: 20.0000 mg | ORAL_TABLET | Freq: Every day | ORAL | Status: DC

## 2015-08-02 NOTE — Addendum Note (Signed)
Addended by: Shon MilletWATLINGTON, Melizza Kanode M on: 08/02/2015 04:28 PM   Modules accepted: Orders

## 2015-08-02 NOTE — Telephone Encounter (Signed)
Pt called back he is moving and has no insurance for the next 2 months, Rx filled for 2 months and pt advise he has to establish care with another provider in his new location because this will be the last Rx we fill without an appt

## 2015-10-31 ENCOUNTER — Other Ambulatory Visit: Payer: Self-pay | Admitting: Family Medicine

## 2015-10-31 NOTE — Telephone Encounter (Signed)
Please schedule a f/u and refill until then 

## 2015-10-31 NOTE — Telephone Encounter (Signed)
Pt hasn't been seen in over a year and no future appts., please advise  

## 2015-10-31 NOTE — Telephone Encounter (Signed)
Left voicemail requesting pt to call office back
